# Patient Record
Sex: Female | Born: 1940 | Race: White | Hispanic: No | Marital: Married | Smoking: Never smoker
Health system: Southern US, Community
[De-identification: ages and names within clinical notes are randomized; demographics above are authoritative.]

## PROBLEM LIST (undated history)

## (undated) DIAGNOSIS — E538 Deficiency of other specified B group vitamins: Secondary | ICD-10-CM

## (undated) DIAGNOSIS — R7303 Prediabetes: Secondary | ICD-10-CM

## (undated) DIAGNOSIS — I10 Essential (primary) hypertension: Secondary | ICD-10-CM

## (undated) HISTORY — PX: TONSILLECTOMY: SUR1361

## (undated) HISTORY — PX: EYE SURGERY: SHX253

## (undated) HISTORY — PX: ABDOMINAL HYSTERECTOMY: SHX81

---

## 2001-05-19 ENCOUNTER — Encounter: Payer: Self-pay | Admitting: Internal Medicine

## 2001-05-19 ENCOUNTER — Encounter: Admission: RE | Admit: 2001-05-19 | Discharge: 2001-05-19 | Payer: Self-pay | Admitting: Internal Medicine

## 2002-01-05 ENCOUNTER — Encounter: Admission: RE | Admit: 2002-01-05 | Discharge: 2002-01-05 | Payer: Self-pay | Admitting: Internal Medicine

## 2002-01-05 ENCOUNTER — Encounter: Payer: Self-pay | Admitting: Internal Medicine

## 2002-07-13 ENCOUNTER — Encounter: Admission: RE | Admit: 2002-07-13 | Discharge: 2002-07-13 | Payer: Self-pay | Admitting: Internal Medicine

## 2002-07-13 ENCOUNTER — Encounter: Payer: Self-pay | Admitting: Internal Medicine

## 2003-02-11 ENCOUNTER — Encounter: Admission: RE | Admit: 2003-02-11 | Discharge: 2003-02-11 | Payer: Self-pay | Admitting: Neurosurgery

## 2003-02-22 ENCOUNTER — Ambulatory Visit (HOSPITAL_COMMUNITY): Admission: RE | Admit: 2003-02-22 | Discharge: 2003-02-22 | Payer: Self-pay | Admitting: Neurosurgery

## 2003-03-14 ENCOUNTER — Encounter: Admission: RE | Admit: 2003-03-14 | Discharge: 2003-03-14 | Payer: Self-pay | Admitting: Neurosurgery

## 2004-08-06 ENCOUNTER — Other Ambulatory Visit: Admission: RE | Admit: 2004-08-06 | Discharge: 2004-08-06 | Payer: Self-pay | Admitting: Internal Medicine

## 2004-09-07 ENCOUNTER — Encounter: Admission: RE | Admit: 2004-09-07 | Discharge: 2004-09-07 | Payer: Self-pay | Admitting: Internal Medicine

## 2005-09-25 ENCOUNTER — Encounter: Admission: RE | Admit: 2005-09-25 | Discharge: 2005-09-25 | Payer: Self-pay | Admitting: Internal Medicine

## 2006-10-03 ENCOUNTER — Encounter: Admission: RE | Admit: 2006-10-03 | Discharge: 2006-10-03 | Payer: Self-pay | Admitting: Internal Medicine

## 2007-10-07 ENCOUNTER — Encounter: Admission: RE | Admit: 2007-10-07 | Discharge: 2007-10-07 | Payer: Self-pay | Admitting: Internal Medicine

## 2008-11-01 ENCOUNTER — Encounter: Admission: RE | Admit: 2008-11-01 | Discharge: 2008-11-01 | Payer: Self-pay | Admitting: Internal Medicine

## 2009-12-04 ENCOUNTER — Encounter: Admission: RE | Admit: 2009-12-04 | Discharge: 2009-12-04 | Payer: Self-pay | Admitting: Internal Medicine

## 2009-12-15 ENCOUNTER — Encounter: Admission: RE | Admit: 2009-12-15 | Discharge: 2009-12-15 | Payer: Self-pay | Admitting: Internal Medicine

## 2010-02-03 ENCOUNTER — Encounter: Payer: Self-pay | Admitting: Neurosurgery

## 2010-02-04 ENCOUNTER — Encounter: Payer: Self-pay | Admitting: Internal Medicine

## 2010-06-01 NOTE — Op Note (Signed)
NAMEARORA, COAKLEY                          ACCOUNT NO.:  [id number]   MEDICAL RECORD NO.:  [id number]                   PATIENT TYPE:  OIB   LOCATION:  2899                                 FACILITY:  [facility]   PHYSICIAN:  Reinaldo Meeker, M.D.              DATE OF BIRTH:  04-01-1940   DATE OF PROCEDURE:  02/22/2003  DATE OF DISCHARGE:                                 OPERATIVE REPORT   SURGEON:  Reinaldo Meeker, M.D.   ASSISTANT:  Kathaleen Maser. Pool, M.D.   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   PROCEDURE:  A C6-7 anterior cervical diskectomy with bone bank fusion,  followed by Zephyr anterior cervical plating.   DESCRIPTION OF PROCEDURE:  After being placed in the supine position and 5  pounds of Holter traction, the patient's neck was prepped and draped in the  usual sterile fashion.  Localizing x-rays were taken prior to the incision,  to identify the appropriate level.  A transverse incision was made in the  right anterior neck, starting at the midline, and headed towards the medial  aspect of the sternocleidomastoid muscle.  The platysma muscle was then  incised transversely.  The natural fascial plane between the strap muscles  medially and the sternocleidomastoid laterally was identified and followed  down to the anterior aspect of the cervical spine.  The longus coli muscles  were identified and split in the midline, and stripped away bilaterally with  the Kitner resection and key elevator.  A self-retaining retractor was  placed for exposure, and an x-ray showed the approach at the appropriate  level.  Using a #15 blade, the annulus and disk at C6-7 was incised.  Using  pituitary rongeurs and curets, approximately 90% of the disk material was  removed.  The microscope was then draped and brought into the field and used  for the remainder of the case.  Using the microdissection technique, the  remainder of the disk material down to the posterior longitudinal ligament  was removed.  A high-speed drill was used to widen the interspace.  The  ligament was then incised transversely and the cut edges removed with the  Kerrison punch.  Inspection of the left C6-7 foramen yielded a large amount  of herniated disk material, and this was removed until the underlying C7  nerve root was well-visualized and decompressed.  Inspection of the right  side was then carried out with the removal of any residual tissue, until the  nerve root on that side was decompressed as well.  At this point inspection  was carried out in all directions for any evidence of residual compression,  and none could be identified.  Large amounts of irrigation were carried out.  Measurements were taken.  A 7 mm bone bank bone was reconstituted.  After  irrigating once more to confirm  hemostasis, the bone was impacted without  difficulty.  A 23 mm Zephyr anterior cervical plate was then chosen.  Drill  holes were then placed, followed by the placement of 13 mm screws x4.  The  locking mechanism was then secured.  A final x-ray showed the plate, screws  and plug to all be in good position.  Large amounts of irrigation were  carried out at this time, and any bleeding controlled with bipolar  electrocoagulation.  The wound was then closed using interrupted Vicryl in  the platysma muscle, inverted #5-0 PDS in the subcuticular layer, and Steri-  Strips on the skin.  A soft collar was then applied.  The patient was extubated and taken to the recovery room in stable  condition.                                              Reinaldo Meeker, M.D.   ROK/MEDQ  D:  02/22/2003  T:  02/22/2003  Job:  [id number]

## 2010-11-05 ENCOUNTER — Other Ambulatory Visit: Payer: Self-pay | Admitting: Internal Medicine

## 2010-11-05 DIAGNOSIS — R1010 Upper abdominal pain, unspecified: Secondary | ICD-10-CM

## 2010-11-06 ENCOUNTER — Ambulatory Visit
Admission: RE | Admit: 2010-11-06 | Discharge: 2010-11-06 | Disposition: A | Discharge status: Home / Self Care | Payer: Medicare Other | Source: Ambulatory Visit | Attending: Internal Medicine | Admitting: Internal Medicine

## 2010-11-06 DIAGNOSIS — R1010 Upper abdominal pain, unspecified: Secondary | ICD-10-CM

## 2010-11-13 ENCOUNTER — Other Ambulatory Visit: Payer: Self-pay | Admitting: Internal Medicine

## 2010-11-13 DIAGNOSIS — Z1231 Encounter for screening mammogram for malignant neoplasm of breast: Secondary | ICD-10-CM

## 2010-12-21 ENCOUNTER — Ambulatory Visit
Admission: RE | Admit: 2010-12-21 | Discharge: 2010-12-21 | Disposition: A | Discharge status: Home / Self Care | Payer: Medicare Other | Source: Ambulatory Visit | Attending: Internal Medicine | Admitting: Internal Medicine

## 2010-12-21 DIAGNOSIS — Z1231 Encounter for screening mammogram for malignant neoplasm of breast: Secondary | ICD-10-CM

## 2011-11-18 ENCOUNTER — Other Ambulatory Visit: Payer: Self-pay | Admitting: Internal Medicine

## 2011-11-18 DIAGNOSIS — Z1231 Encounter for screening mammogram for malignant neoplasm of breast: Secondary | ICD-10-CM

## 2011-12-27 ENCOUNTER — Ambulatory Visit
Admission: RE | Admit: 2011-12-27 | Discharge: 2011-12-27 | Disposition: A | Discharge status: Home / Self Care | Payer: Medicare Other | Source: Ambulatory Visit | Attending: Internal Medicine | Admitting: Internal Medicine

## 2011-12-27 DIAGNOSIS — Z1231 Encounter for screening mammogram for malignant neoplasm of breast: Secondary | ICD-10-CM

## 2012-11-24 ENCOUNTER — Other Ambulatory Visit: Payer: Self-pay

## 2012-11-24 DIAGNOSIS — Z1231 Encounter for screening mammogram for malignant neoplasm of breast: Secondary | ICD-10-CM

## 2012-12-30 ENCOUNTER — Ambulatory Visit: Payer: Medicare Other

## 2013-01-21 ENCOUNTER — Ambulatory Visit
Admission: RE | Admit: 2013-01-21 | Discharge: 2013-01-21 | Disposition: A | Discharge status: Home / Self Care | Payer: Medicare Other | Source: Ambulatory Visit

## 2013-01-21 DIAGNOSIS — Z1231 Encounter for screening mammogram for malignant neoplasm of breast: Secondary | ICD-10-CM

## 2013-12-17 ENCOUNTER — Other Ambulatory Visit: Payer: Self-pay

## 2013-12-17 DIAGNOSIS — Z1231 Encounter for screening mammogram for malignant neoplasm of breast: Secondary | ICD-10-CM

## 2014-01-25 ENCOUNTER — Ambulatory Visit
Admission: RE | Admit: 2014-01-25 | Discharge: 2014-01-25 | Disposition: A | Discharge status: Home / Self Care | Payer: Medicare (Managed Care) | Source: Ambulatory Visit

## 2014-01-25 DIAGNOSIS — Z1231 Encounter for screening mammogram for malignant neoplasm of breast: Secondary | ICD-10-CM

## 2014-05-03 ENCOUNTER — Other Ambulatory Visit: Payer: Self-pay | Admitting: Internal Medicine

## 2014-05-03 DIAGNOSIS — R1010 Upper abdominal pain, unspecified: Secondary | ICD-10-CM

## 2014-12-22 ENCOUNTER — Other Ambulatory Visit: Payer: Self-pay

## 2014-12-22 DIAGNOSIS — Z1231 Encounter for screening mammogram for malignant neoplasm of breast: Secondary | ICD-10-CM

## 2015-01-30 ENCOUNTER — Ambulatory Visit
Admission: RE | Admit: 2015-01-30 | Discharge: 2015-01-30 | Disposition: A | Discharge status: Home / Self Care | Payer: Medicare Other | Source: Ambulatory Visit

## 2015-01-30 DIAGNOSIS — Z1231 Encounter for screening mammogram for malignant neoplasm of breast: Secondary | ICD-10-CM

## 2015-12-26 ENCOUNTER — Other Ambulatory Visit: Payer: Self-pay | Admitting: Internal Medicine

## 2015-12-26 DIAGNOSIS — Z1231 Encounter for screening mammogram for malignant neoplasm of breast: Secondary | ICD-10-CM

## 2016-02-01 ENCOUNTER — Ambulatory Visit: Payer: Medicare Other

## 2016-02-23 ENCOUNTER — Ambulatory Visit: Payer: Medicare Other

## 2016-04-18 ENCOUNTER — Ambulatory Visit
Admission: RE | Admit: 2016-04-18 | Discharge: 2016-04-18 | Disposition: A | Discharge status: Home / Self Care | Payer: Medicare Other | Source: Ambulatory Visit | Attending: Internal Medicine | Admitting: Internal Medicine

## 2016-04-18 DIAGNOSIS — Z1231 Encounter for screening mammogram for malignant neoplasm of breast: Secondary | ICD-10-CM

## 2017-07-21 ENCOUNTER — Other Ambulatory Visit: Payer: Self-pay | Admitting: Internal Medicine

## 2017-07-21 DIAGNOSIS — Z1231 Encounter for screening mammogram for malignant neoplasm of breast: Secondary | ICD-10-CM

## 2017-08-26 ENCOUNTER — Ambulatory Visit
Admission: RE | Admit: 2017-08-26 | Discharge: 2017-08-26 | Disposition: A | Discharge status: Home / Self Care | Payer: Medicare Other | Source: Ambulatory Visit | Attending: Internal Medicine | Admitting: Internal Medicine

## 2017-08-26 DIAGNOSIS — Z1231 Encounter for screening mammogram for malignant neoplasm of breast: Secondary | ICD-10-CM

## 2018-07-21 ENCOUNTER — Other Ambulatory Visit: Payer: Self-pay | Admitting: Internal Medicine

## 2018-07-21 DIAGNOSIS — Z1231 Encounter for screening mammogram for malignant neoplasm of breast: Secondary | ICD-10-CM

## 2018-09-07 ENCOUNTER — Ambulatory Visit: Payer: Medicare Other

## 2018-10-01 ENCOUNTER — Ambulatory Visit
Admission: RE | Admit: 2018-10-01 | Discharge: 2018-10-01 | Disposition: A | Discharge status: Home / Self Care | Payer: Medicare Other | Source: Ambulatory Visit | Attending: Internal Medicine | Admitting: Internal Medicine

## 2018-10-01 ENCOUNTER — Other Ambulatory Visit: Payer: Self-pay

## 2018-10-01 DIAGNOSIS — Z1231 Encounter for screening mammogram for malignant neoplasm of breast: Secondary | ICD-10-CM

## 2019-03-29 DIAGNOSIS — R195 Other fecal abnormalities: Secondary | ICD-10-CM | POA: Diagnosis not present

## 2019-04-29 DIAGNOSIS — Z1159 Encounter for screening for other viral diseases: Secondary | ICD-10-CM | POA: Diagnosis not present

## 2019-05-04 DIAGNOSIS — K5730 Diverticulosis of large intestine without perforation or abscess without bleeding: Secondary | ICD-10-CM | POA: Diagnosis not present

## 2019-05-04 DIAGNOSIS — R195 Other fecal abnormalities: Secondary | ICD-10-CM | POA: Diagnosis not present

## 2019-05-04 DIAGNOSIS — K5520 Angiodysplasia of colon without hemorrhage: Secondary | ICD-10-CM | POA: Diagnosis not present

## 2019-05-04 DIAGNOSIS — D122 Benign neoplasm of ascending colon: Secondary | ICD-10-CM | POA: Diagnosis not present

## 2019-05-07 DIAGNOSIS — D122 Benign neoplasm of ascending colon: Secondary | ICD-10-CM | POA: Diagnosis not present

## 2019-07-26 DIAGNOSIS — D225 Melanocytic nevi of trunk: Secondary | ICD-10-CM | POA: Diagnosis not present

## 2019-07-26 DIAGNOSIS — D2261 Melanocytic nevi of right upper limb, including shoulder: Secondary | ICD-10-CM | POA: Diagnosis not present

## 2019-07-26 DIAGNOSIS — X32XXXA Exposure to sunlight, initial encounter: Secondary | ICD-10-CM | POA: Diagnosis not present

## 2019-07-26 DIAGNOSIS — D2271 Melanocytic nevi of right lower limb, including hip: Secondary | ICD-10-CM | POA: Diagnosis not present

## 2019-07-26 DIAGNOSIS — D2262 Melanocytic nevi of left upper limb, including shoulder: Secondary | ICD-10-CM | POA: Diagnosis not present

## 2019-07-26 DIAGNOSIS — L668 Other cicatricial alopecia: Secondary | ICD-10-CM | POA: Diagnosis not present

## 2019-07-26 DIAGNOSIS — L570 Actinic keratosis: Secondary | ICD-10-CM | POA: Diagnosis not present

## 2019-07-26 DIAGNOSIS — Z85828 Personal history of other malignant neoplasm of skin: Secondary | ICD-10-CM | POA: Diagnosis not present

## 2019-08-27 ENCOUNTER — Other Ambulatory Visit: Payer: Self-pay | Admitting: Internal Medicine

## 2019-08-27 DIAGNOSIS — Z1231 Encounter for screening mammogram for malignant neoplasm of breast: Secondary | ICD-10-CM

## 2019-09-14 DIAGNOSIS — H5203 Hypermetropia, bilateral: Secondary | ICD-10-CM | POA: Diagnosis not present

## 2019-09-14 DIAGNOSIS — H35033 Hypertensive retinopathy, bilateral: Secondary | ICD-10-CM | POA: Diagnosis not present

## 2019-10-04 ENCOUNTER — Ambulatory Visit
Admission: RE | Admit: 2019-10-04 | Discharge: 2019-10-04 | Disposition: A | Discharge status: Home / Self Care | Payer: Medicare PPO | Source: Ambulatory Visit | Attending: Internal Medicine | Admitting: Internal Medicine

## 2019-10-04 ENCOUNTER — Other Ambulatory Visit: Payer: Self-pay

## 2019-10-04 DIAGNOSIS — Z1231 Encounter for screening mammogram for malignant neoplasm of breast: Secondary | ICD-10-CM | POA: Diagnosis not present

## 2019-10-08 ENCOUNTER — Other Ambulatory Visit: Payer: Self-pay | Admitting: Internal Medicine

## 2019-10-08 ENCOUNTER — Ambulatory Visit
Admission: RE | Admit: 2019-10-08 | Discharge: 2019-10-08 | Disposition: A | Discharge status: Home / Self Care | Payer: Medicare PPO | Source: Ambulatory Visit | Attending: Internal Medicine | Admitting: Internal Medicine

## 2019-10-08 DIAGNOSIS — R1900 Intra-abdominal and pelvic swelling, mass and lump, unspecified site: Secondary | ICD-10-CM | POA: Diagnosis not present

## 2019-10-08 DIAGNOSIS — K5730 Diverticulosis of large intestine without perforation or abscess without bleeding: Secondary | ICD-10-CM | POA: Diagnosis not present

## 2019-10-08 DIAGNOSIS — R109 Unspecified abdominal pain: Secondary | ICD-10-CM

## 2019-10-08 MED ORDER — IOPAMIDOL (ISOVUE-300) INJECTION 61%
100.0000 mL | Freq: Once | INTRAVENOUS | Status: AC | PRN
  Administered 2019-10-08: 100 mL via INTRAVENOUS

## 2019-11-03 DIAGNOSIS — Z23 Encounter for immunization: Secondary | ICD-10-CM | POA: Diagnosis not present

## 2019-11-09 DIAGNOSIS — H35363 Drusen (degenerative) of macula, bilateral: Secondary | ICD-10-CM | POA: Diagnosis not present

## 2019-11-24 ENCOUNTER — Ambulatory Visit: Payer: Medicare PPO | Attending: Internal Medicine

## 2019-11-24 ENCOUNTER — Other Ambulatory Visit (HOSPITAL_COMMUNITY): Payer: Self-pay | Admitting: Internal Medicine

## 2019-11-24 DIAGNOSIS — Z23 Encounter for immunization: Secondary | ICD-10-CM

## 2019-11-24 NOTE — Progress Notes (Signed)
   Covid-19 Vaccination Clinic  Name:  Anne Ford    MRN: [id number] DOB: Mar 17, 1940  11/24/2019  [name] was observed post Covid-19 immunization for 15 minutes without incident. She was provided with Vaccine Information Sheet and instruction to access the V-Safe system.   [name] was instructed to call 911 with any severe reactions post vaccine: [name] Kitchen Difficulty breathing  . Swelling of face and throat  . A fast heartbeat  . A bad rash all over body  . Dizziness and weakness

## 2019-11-25 DIAGNOSIS — H18413 Arcus senilis, bilateral: Secondary | ICD-10-CM | POA: Diagnosis not present

## 2019-11-25 DIAGNOSIS — H353132 Nonexudative age-related macular degeneration, bilateral, intermediate dry stage: Secondary | ICD-10-CM | POA: Diagnosis not present

## 2019-11-25 DIAGNOSIS — H26493 Other secondary cataract, bilateral: Secondary | ICD-10-CM | POA: Diagnosis not present

## 2019-11-25 DIAGNOSIS — H26491 Other secondary cataract, right eye: Secondary | ICD-10-CM | POA: Diagnosis not present

## 2019-11-25 DIAGNOSIS — Z961 Presence of intraocular lens: Secondary | ICD-10-CM | POA: Diagnosis not present

## 2019-11-29 DIAGNOSIS — H02811 Retained foreign body in right upper eyelid: Secondary | ICD-10-CM | POA: Diagnosis not present

## 2019-11-29 DIAGNOSIS — H26491 Other secondary cataract, right eye: Secondary | ICD-10-CM | POA: Diagnosis not present

## 2019-12-14 DIAGNOSIS — H26492 Other secondary cataract, left eye: Secondary | ICD-10-CM | POA: Diagnosis not present

## 2020-02-03 DIAGNOSIS — I10 Essential (primary) hypertension: Secondary | ICD-10-CM | POA: Diagnosis not present

## 2020-02-03 DIAGNOSIS — M810 Age-related osteoporosis without current pathological fracture: Secondary | ICD-10-CM | POA: Diagnosis not present

## 2020-02-03 DIAGNOSIS — J309 Allergic rhinitis, unspecified: Secondary | ICD-10-CM | POA: Diagnosis not present

## 2020-02-03 DIAGNOSIS — Z1389 Encounter for screening for other disorder: Secondary | ICD-10-CM | POA: Diagnosis not present

## 2020-02-03 DIAGNOSIS — Z0000 Encounter for general adult medical examination without abnormal findings: Secondary | ICD-10-CM | POA: Diagnosis not present

## 2020-02-03 DIAGNOSIS — E7800 Pure hypercholesterolemia, unspecified: Secondary | ICD-10-CM | POA: Diagnosis not present

## 2020-02-03 DIAGNOSIS — R7309 Other abnormal glucose: Secondary | ICD-10-CM | POA: Diagnosis not present

## 2020-02-05 ENCOUNTER — Other Ambulatory Visit: Payer: Self-pay | Admitting: Internal Medicine

## 2020-02-05 DIAGNOSIS — M810 Age-related osteoporosis without current pathological fracture: Secondary | ICD-10-CM

## 2020-02-11 ENCOUNTER — Other Ambulatory Visit: Payer: Self-pay | Admitting: Internal Medicine

## 2020-02-11 DIAGNOSIS — Z1231 Encounter for screening mammogram for malignant neoplasm of breast: Secondary | ICD-10-CM

## 2020-02-28 DIAGNOSIS — L299 Pruritus, unspecified: Secondary | ICD-10-CM | POA: Diagnosis not present

## 2020-02-28 DIAGNOSIS — L821 Other seborrheic keratosis: Secondary | ICD-10-CM | POA: Diagnosis not present

## 2020-07-25 DIAGNOSIS — D2262 Melanocytic nevi of left upper limb, including shoulder: Secondary | ICD-10-CM | POA: Diagnosis not present

## 2020-07-25 DIAGNOSIS — D2271 Melanocytic nevi of right lower limb, including hip: Secondary | ICD-10-CM | POA: Diagnosis not present

## 2020-07-25 DIAGNOSIS — D2261 Melanocytic nevi of right upper limb, including shoulder: Secondary | ICD-10-CM | POA: Diagnosis not present

## 2020-07-25 DIAGNOSIS — L570 Actinic keratosis: Secondary | ICD-10-CM | POA: Diagnosis not present

## 2020-07-25 DIAGNOSIS — X32XXXA Exposure to sunlight, initial encounter: Secondary | ICD-10-CM | POA: Diagnosis not present

## 2020-07-25 DIAGNOSIS — D2272 Melanocytic nevi of left lower limb, including hip: Secondary | ICD-10-CM | POA: Diagnosis not present

## 2020-07-25 DIAGNOSIS — Z85828 Personal history of other malignant neoplasm of skin: Secondary | ICD-10-CM | POA: Diagnosis not present

## 2020-07-25 DIAGNOSIS — L821 Other seborrheic keratosis: Secondary | ICD-10-CM | POA: Diagnosis not present

## 2020-09-07 ENCOUNTER — Ambulatory Visit
Admission: RE | Admit: 2020-09-07 | Discharge: 2020-09-07 | Disposition: A | Discharge status: Home / Self Care | Payer: Medicare PPO | Source: Ambulatory Visit | Attending: Physician Assistant | Admitting: Physician Assistant

## 2020-09-07 ENCOUNTER — Other Ambulatory Visit: Payer: Self-pay | Admitting: Physician Assistant

## 2020-09-07 DIAGNOSIS — M545 Low back pain, unspecified: Secondary | ICD-10-CM

## 2020-09-07 DIAGNOSIS — R350 Frequency of micturition: Secondary | ICD-10-CM | POA: Diagnosis not present

## 2020-09-07 DIAGNOSIS — R102 Pelvic and perineal pain: Secondary | ICD-10-CM | POA: Diagnosis not present

## 2020-09-07 DIAGNOSIS — I10 Essential (primary) hypertension: Secondary | ICD-10-CM | POA: Diagnosis not present

## 2020-09-25 DIAGNOSIS — H524 Presbyopia: Secondary | ICD-10-CM | POA: Diagnosis not present

## 2020-10-04 ENCOUNTER — Ambulatory Visit
Admission: RE | Admit: 2020-10-04 | Discharge: 2020-10-04 | Disposition: A | Discharge status: Home / Self Care | Payer: Medicare PPO | Source: Ambulatory Visit | Attending: Internal Medicine | Admitting: Internal Medicine

## 2020-10-04 ENCOUNTER — Other Ambulatory Visit: Payer: Self-pay

## 2020-10-04 DIAGNOSIS — M810 Age-related osteoporosis without current pathological fracture: Secondary | ICD-10-CM

## 2020-10-04 DIAGNOSIS — M85851 Other specified disorders of bone density and structure, right thigh: Secondary | ICD-10-CM | POA: Diagnosis not present

## 2020-10-04 DIAGNOSIS — Z780 Asymptomatic menopausal state: Secondary | ICD-10-CM | POA: Diagnosis not present

## 2020-10-04 DIAGNOSIS — Z1231 Encounter for screening mammogram for malignant neoplasm of breast: Secondary | ICD-10-CM

## 2020-10-23 DIAGNOSIS — J309 Allergic rhinitis, unspecified: Secondary | ICD-10-CM | POA: Diagnosis not present

## 2020-10-23 DIAGNOSIS — H9202 Otalgia, left ear: Secondary | ICD-10-CM | POA: Diagnosis not present

## 2020-12-13 DIAGNOSIS — Z23 Encounter for immunization: Secondary | ICD-10-CM | POA: Diagnosis not present

## 2020-12-13 DIAGNOSIS — R413 Other amnesia: Secondary | ICD-10-CM | POA: Diagnosis not present

## 2020-12-18 ENCOUNTER — Other Ambulatory Visit: Payer: Self-pay | Admitting: Internal Medicine

## 2020-12-18 DIAGNOSIS — R413 Other amnesia: Secondary | ICD-10-CM

## 2020-12-22 ENCOUNTER — Ambulatory Visit
Admission: RE | Admit: 2020-12-22 | Discharge: 2020-12-22 | Disposition: A | Discharge status: Home / Self Care | Payer: Medicare PPO | Source: Ambulatory Visit | Attending: Internal Medicine | Admitting: Internal Medicine

## 2020-12-22 ENCOUNTER — Other Ambulatory Visit: Payer: Self-pay

## 2020-12-22 DIAGNOSIS — R413 Other amnesia: Secondary | ICD-10-CM

## 2020-12-22 DIAGNOSIS — G319 Degenerative disease of nervous system, unspecified: Secondary | ICD-10-CM | POA: Diagnosis not present

## 2021-02-09 DIAGNOSIS — R7303 Prediabetes: Secondary | ICD-10-CM | POA: Diagnosis not present

## 2021-02-09 DIAGNOSIS — E782 Mixed hyperlipidemia: Secondary | ICD-10-CM | POA: Diagnosis not present

## 2021-02-09 DIAGNOSIS — I10 Essential (primary) hypertension: Secondary | ICD-10-CM | POA: Diagnosis not present

## 2021-02-09 DIAGNOSIS — R202 Paresthesia of skin: Secondary | ICD-10-CM | POA: Diagnosis not present

## 2021-02-09 DIAGNOSIS — J309 Allergic rhinitis, unspecified: Secondary | ICD-10-CM | POA: Diagnosis not present

## 2021-02-09 DIAGNOSIS — M810 Age-related osteoporosis without current pathological fracture: Secondary | ICD-10-CM | POA: Diagnosis not present

## 2021-02-09 DIAGNOSIS — Z1389 Encounter for screening for other disorder: Secondary | ICD-10-CM | POA: Diagnosis not present

## 2021-02-09 DIAGNOSIS — K219 Gastro-esophageal reflux disease without esophagitis: Secondary | ICD-10-CM | POA: Diagnosis not present

## 2021-02-09 DIAGNOSIS — R413 Other amnesia: Secondary | ICD-10-CM | POA: Diagnosis not present

## 2021-02-09 DIAGNOSIS — Z0000 Encounter for general adult medical examination without abnormal findings: Secondary | ICD-10-CM | POA: Diagnosis not present

## 2021-02-15 ENCOUNTER — Ambulatory Visit: Payer: Medicare PPO | Admitting: Physician Assistant

## 2021-02-15 ENCOUNTER — Other Ambulatory Visit: Payer: Self-pay

## 2021-02-15 ENCOUNTER — Other Ambulatory Visit (INDEPENDENT_AMBULATORY_CARE_PROVIDER_SITE_OTHER): Payer: Medicare PPO

## 2021-02-15 VITALS — BP 127/76 | HR 80 | Ht 64.0 in | Wt 142.0 lb

## 2021-02-15 DIAGNOSIS — G309 Alzheimer's disease, unspecified: Secondary | ICD-10-CM | POA: Diagnosis not present

## 2021-02-15 DIAGNOSIS — F0280 Dementia in other diseases classified elsewhere without behavioral disturbance: Secondary | ICD-10-CM | POA: Diagnosis not present

## 2021-02-15 DIAGNOSIS — R202 Paresthesia of skin: Secondary | ICD-10-CM | POA: Insufficient documentation

## 2021-02-15 DIAGNOSIS — M9390 Osteochondropathy, unspecified of unspecified site: Secondary | ICD-10-CM | POA: Insufficient documentation

## 2021-02-15 DIAGNOSIS — R413 Other amnesia: Secondary | ICD-10-CM | POA: Insufficient documentation

## 2021-02-15 DIAGNOSIS — K219 Gastro-esophageal reflux disease without esophagitis: Secondary | ICD-10-CM | POA: Insufficient documentation

## 2021-02-15 DIAGNOSIS — G629 Polyneuropathy, unspecified: Secondary | ICD-10-CM | POA: Diagnosis not present

## 2021-02-15 DIAGNOSIS — E7800 Pure hypercholesterolemia, unspecified: Secondary | ICD-10-CM | POA: Insufficient documentation

## 2021-02-15 DIAGNOSIS — R7303 Prediabetes: Secondary | ICD-10-CM | POA: Insufficient documentation

## 2021-02-15 DIAGNOSIS — I10 Essential (primary) hypertension: Secondary | ICD-10-CM | POA: Insufficient documentation

## 2021-02-15 DIAGNOSIS — J309 Allergic rhinitis, unspecified: Secondary | ICD-10-CM | POA: Insufficient documentation

## 2021-02-15 LAB — VITAMIN B12: Vitamin B-12: 252 pg/mL (ref 211–911)

## 2021-02-15 LAB — TSH: TSH: 1.94 u[IU]/mL (ref 0.35–5.50)

## 2021-02-15 NOTE — Progress Notes (Signed)
Assessment/Plan:   Anne Ford is a very pleasant 81 y.o. year old RH female with  a history of hypertension, hyperlipidemia, vitamin D deficiency, prediabetes, anxiety, depression seen today for evaluation of memory loss and paresthesias. MoCA today is 15/30, with deficiencies in delayed recall 0/5, abstraction, naming 1/3, and visuospatial 3/5. Patient is on memantine 10 mg twice daily by PCP.  (Was on memantine daily until late January).  Last CT of the head was remarkable for mild generalized cerebral and cerebellar atrophy without acute intracranial abnormalities.   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  Discussed the importance of regular daily schedule with inclusion of crossword puzzles to maintain brain function.  Continue to monitor mood with PCP.  Stay active at least 30 minutes at least 3 times a week.  Naps should be scheduled and should be no longer than 60 minutes and should not occur after 2 PM.  Control cardiovascular risk factors  Monitor driving Mediterranean diet is recommended  Folllow up  in 1 month   Paresthesias  Patient complains of bilateral paresthesias, worse from the elbow down to the hands, and also on the feet bilaterally, left greater than right.  She denies any prior history of a stroke.  Exam and work-up essentially unremarkable for stroke findings, she does show some slow speech, but according to her family this has improved over the last 2 weeks, of unknown significance. Will check NCV-EMG Follow-up in 1 month   Subjective:    The patient is seen in neurologic consultation at the request of Georgann Housekeeper, MD for the evaluation of memory.  The patient is accompanied by  who supplements the history.  This is a 81 y.o. year old RH  female who has had memory issues for about 1.5 years, worse over the last 2 months.  During the last Thanksgiving, her family noticed that she was having worsening of memory and speech disturbance.  Church members began to notice as well, when she was repeating the same stories, asking the same questions.  Her PCP placed her on memantine 5 mg daily, then increasing it to 10 mg daily which "for a while it helped especially with the slurring ", but over the last 2 weeks, he increase it to 10 mg twice daily without significant improvement.  Her speech continues to be slow, but family reports that this is better than prior.  She becomes very upset because she has always been very organized, in charge of every document in the house, and now she is having problems and trying to stay focused and organized.  She denies being confused when entering the room, or leaving objects in unusual places.  She continues to drive but short distances, because over the last 6 months, if not using the GPS she will become lost if driving to unknown locations.  This has brought significant amount of frustration on her.  She becomes tearful at times, when she thinks that she could have Alzheimer's disease, as she took care of her mother with Alzheimer's when she was 17 years old.  She continues to read and do crossword puzzles and word findings.  She sleeps well, denies vivid dreams, sleepwalking, hallucinations or paranoia.  She is independent of bathing and dressing, there are  no hygiene concerns.  She puts the medications on a pillbox (she could not think of the word "pillbox "), and checks a calendar frequently did not forget appointments.  Her husband has taken over the finances since last month.  Her appetite is good, denies trouble swallowing.  She does not cook very often.  She ambulates without a cane, but she had a couple of falls, one in June due to heat exhaustion resulting in presyncope, and  another mechanical fall hitting the right side of her head in November 2022 without loss of consciousness.  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  She denies focal numbness.  She denies a history of stroke.  She denies using the computer on excessive basis.  Denies hormonal therapy, or long distance trips.  Denies chest pain or palpitations.  Denies shortness of breath.  Denies a history of tremors or anosmia.  No history of seizures.  Denies urine frequency, but she does have mild incontinence.  Denies constipation or diarrhea.  Denies a history of sleep apnea, alcohol or tobacco.  States she has strong family history of Alzheimer's disease in mother, 2 sisters and maternal grandmother.   Recent labs 02/09/2021 shows abnormal lipid panel with TC 253, triglycerides 218, LDL 154 CMP calcium 10.8, creatinine 1.35. CBC was normal  CT scan of the head 12/22/2020 remarkable for no intracranial abnormality. Mild generalized cerebral and cerebellar atrophy.  Mild right ethmoid sinus mucosal thickening.    Allergies  Allergen Reactions   Codeine Other (See Comments)    GI upset   Pravastatin Sodium Other (See Comments)    Joint pain   Amoxicillin-Pot Clavulanate Nausea Only and Nausea And Vomiting   Latex Rash   Morphine Other (See Comments)    Other Reaction: CNS Disorder Other Reaction: CNS Disorder    Neomycin-Bacitracin-Polymyxin [Bacitracin-Neomycin-Polymyxin] Rash    Current Outpatient Medications  Medication Instructions   atenolol (TENORMIN) 25 MG tablet Oral   felodipine (PLENDIL) 5 MG 24 hr tablet Oral   fluticasone (FLONASE) 50 MCG/ACT nasal spray Each Nare   hydrochlorothiazide (HYDRODIURIL) 25 MG tablet Oral   loratadine  (CLARITIN) 10 MG tablet 1 tablet   memantine (NAMENDA) 10 mg, Oral, 2 times daily     VITALS:   Vitals:   02/15/21 0951  BP: 127/76  Pulse: 80  SpO2: 95%  Weight: 142 lb (64.4 kg)  Height: 5\' 4"  (1.626 m)   No flowsheet data found.  PHYSICAL EXAM   HEENT:  Normocephalic, atraumatic. The mucous membranes are moist. The superficial temporal arteries are without ropiness or tenderness. Cardiovascular: Regular rate and rhythm. Lungs: Clear to auscultation bilaterally. Neck: There are no carotid bruits noted bilaterally.  NEUROLOGICAL: Montreal Cognitive Assessment  02/16/2021  Visuospatial/ Executive (0/5) 3  Naming (0/3) 1  Attention: Read list of digits (0/2) 2  Attention: Read list of letters (0/1) 1  Attention: Serial 7 subtraction starting at 100 (0/3) 0  Language: Repeat phrase (0/2) 2  Language : Fluency (0/1) 1  Abstraction (0/2) 0  Delayed Recall (0/5) 0  Orientation (0/6) 5  Total 15  Adjusted Score (based on education) 15   No flowsheet data found.  No flowsheet data found.   Orientation:  Alert and  oriented to person, place and time. No aphasia or dysarthria, but she does demonstrate slower speech. Fund of knowledge is appropriate. Recent memory impaired and remote memory intact.  Attention and concentration are reduced.  Able to name objects 1/3  and able to repeat phrases. Delayed recall 0/5 Cranial nerves: There is good facial symmetry. Extraocular muscles are intact and visual fields are full to confrontational testing. Speech is slow and clear. Soft palate rises symmetrically and there is no tongue deviation. Hearing is intact to conversational tone.  No dystonia or tardive dyskinesia noted Tone: Tone is good throughout.  No cogwheel. Sensation: Sensation is intact to light touch and pinprick throughout. Vibration is intact at the bilateral big toe.There is no extinction with double simultaneous stimulation. There is no sensory dermatomal level  identified. Coordination: The patient has no difficulty with RAM's or FNF bilaterally. Normal finger to nose  Motor: Strength is 5/5 in the bilateral upper and lower extremities. There is no pronator drift. There are no fasciculations noted. DTR's: Deep tendon reflexes are 2/4 at the bilateral biceps, triceps, brachioradialis, patella and achilles.  Plantar responses are downgoing bilaterally. Gait and Station: The patient is able to ambulate without difficulty, but she does complain of "feet feeling weird when walking ".The patient is able to heel toe walk without any difficulty.The patient is able to ambulate in a tandem fashion. The patient is able to stand in the Romberg position.     Thank you for allowing us the opportunity to participate in the care of this nice patient. Please do not hesitate to contact us for any questions or concerns.   Total time spent on today's visit was 70 minutes, including both face-to-face time and nonface-to-face time.  Time included that spent on review of records (prior notes available to me/labs/imaging if pertinent), discussing treatment and goals, answering patient's questions and coordinating care.  Cc:  Georgann HousekeeperHusain, Karrar, MD  Marlowe KaysSara Niles Ess 02/16/2021 8:51 AM

## 2021-02-15 NOTE — Patient Instructions (Addendum)
It was a pleasure to see you today at our office.   Recommendations:  MRI of the brain, the radiology office will call you to arrange you appointment NCS/EMG Check labs today Continue Memantine 10 mg  2 times a day  Follow up 1 month   RECOMMENDATIONS FOR ALL PATIENTS WITH MEMORY PROBLEMS: 1. Continue to exercise (Recommend 30 minutes of walking everyday, or 3 hours every week) 2. Increase social interactions - continue going to [place] and enjoy social gatherings with friends and family 3. Eat healthy, avoid fried foods and eat more fruits and vegetables 4. Maintain adequate blood pressure, blood sugar, and blood cholesterol level. Reducing the risk of stroke and cardiovascular disease also helps promoting better memory. 5. Avoid stressful situations. Live a simple life and avoid aggravations. Organize your time and prepare for the next day in anticipation. 6. Sleep well, avoid any interruptions of sleep and avoid any distractions in the bedroom that may interfere with adequate sleep quality 7. Avoid sugar, avoid sweets as there is a strong link between excessive sugar intake, diabetes, and cognitive impairment We discussed the Mediterranean diet, which has been shown to help patients reduce the risk of progressive memory disorders and reduces cardiovascular risk. This includes eating fish, eat fruits and green leafy vegetables, nuts like almonds and hazelnuts, walnuts, and also use olive oil. Avoid fast foods and fried foods as much as possible. Avoid sweets and sugar as sugar use has been linked to worsening of memory function.  There is always a concern of gradual progression of memory problems. If this is the case, then we may need to adjust level of care according to patient needs. Support, both to the patient and caregiver, should then be put into place.     FALL PRECAUTIONS: Be cautious when walking. Scan the area for obstacles that may increase the risk of trips and falls. When  getting up in the mornings, sit up at the edge of the bed for a few minutes before getting out of bed. Consider elevating the bed at the head end to avoid drop of blood pressure when getting up. Walk always in a well-lit room (use night lights in the walls). Avoid area rugs or power cords from appliances in the middle of the walkways. Use a walker or a cane if necessary and consider physical therapy for balance exercise. Get your eyesight checked regularly.  FINANCIAL OVERSIGHT: Supervision, especially oversight when making financial decisions or transactions is also recommended.  HOME SAFETY: Consider the safety of the kitchen when operating appliances like stoves, microwave oven, and blender. Consider having supervision and share cooking responsibilities until no longer able to participate in those. Accidents with firearms and other hazards in the house should be identified and addressed as well.   ABILITY TO BE LEFT ALONE: If patient is unable to contact 911 operator, consider using LifeLine, or when the need is there, arrange for someone to stay with patients. Smoking is a fire hazard, consider supervision or cessation. Risk of wandering should be assessed by caregiver and if detected at any point, supervision and safe proof recommendations should be instituted.  MEDICATION SUPERVISION: Inability to self-administer medication needs to be constantly addressed. Implement a mechanism to ensure safe administration of the medications.   DRIVING: Regarding driving, in patients with progressive memory problems, driving will be impaired. We advise to have someone else do the driving if trouble finding directions or if minor accidents are reported. Independent driving assessment is available to determine safety  of driving.   If you are interested in the driving assessment, you can contact the following:  [employer] in [place]  [place]  [place] [phone] or [phone]    [place] refers to food and lifestyle choices that are based on the traditions of countries located on the [employer]. This way of eating has been shown to help prevent certain conditions and improve outcomes for people who have chronic diseases, like kidney disease and heart disease. What are tips for following this plan? Lifestyle  Cook and eat meals together with your family, when possible. Drink enough fluid to keep your urine clear or pale yellow. Be physically active every day. This includes: Aerobic exercise like running or swimming. Leisure activities like gardening, walking, or housework. Get 7-8 hours of sleep each night. If recommended by your health care provider, drink red wine in moderation. This means 1 glass a day for nonpregnant women and 2 glasses a day for men. A glass of wine equals 5 oz (150 mL). Reading food labels  Check the serving size of packaged foods. For foods such as rice and pasta, the serving size refers to the amount of cooked product, not dry. Check the total fat in packaged foods. Avoid foods that have saturated fat or trans fats. Check the ingredients list for added sugars, such as corn syrup. Shopping  At the grocery store, buy most of your food from the areas near the walls of the store. This includes: Fresh fruits and vegetables (produce). Grains, beans, nuts, and seeds. Some of these may be available in unpackaged forms or large amounts (in bulk). Fresh seafood. Poultry and eggs. Low-fat dairy products. Buy whole ingredients instead of prepackaged foods. Buy fresh fruits and vegetables in-season from local farmers markets. Buy frozen fruits and vegetables in resealable bags. If you do not have access to quality fresh seafood, buy precooked frozen shrimp or canned fish, such as tuna, salmon, or sardines. Buy  small amounts of raw or cooked vegetables, salads, or olives from the deli or salad bar at your store. Stock your pantry so you always have certain foods on hand, such as olive oil, canned tuna, canned tomatoes, rice, pasta, and beans. Cooking  Cook foods with extra-virgin olive oil instead of using butter or other vegetable oils. Have meat as a side dish, and have vegetables or grains as your main dish. This means having meat in small portions or adding small amounts of meat to foods like pasta or stew. Use beans or vegetables instead of meat in common dishes like chili or lasagna. Experiment with different cooking methods. Try roasting or broiling vegetables instead of steaming or sauteing them. Add frozen vegetables to soups, stews, pasta, or rice. Add nuts or seeds for added healthy fat at each meal. You can add these to yogurt, salads, or vegetable dishes. Marinate fish or vegetables using olive oil, lemon juice, garlic, and fresh herbs. Meal planning  Plan to eat 1 vegetarian meal one day each week. Try to work up to 2 vegetarian meals, if possible. Eat seafood 2 or more times a week. Have healthy snacks readily available, such as: Vegetable sticks with hummus. Greek yogurt. Fruit and nut trail mix. Eat balanced meals throughout the week. This includes: Fruit: 2-3 servings a day Vegetables: 4-5 servings a day Low-fat dairy: 2 servings a day Fish, poultry, or lean meat: 1 serving a day Beans and  legumes: 2 or more servings a week Nuts and seeds: 1-2 servings a day Whole grains: 6-8 servings a day Extra-virgin olive oil: 3-4 servings a day Limit red meat and sweets to only a few servings a month What are my food choices? Mediterranean diet Recommended Grains: Whole-grain pasta. Brown rice. Bulgar wheat. Polenta. Couscous. Whole-wheat bread. [place]. Vegetables: Artichokes. Beets. Broccoli. Cabbage. Carrots. Eggplant. Green beans. Chard. Kale. Spinach. Onions. Leeks. Peas.  Squash. Tomatoes. Peppers. Radishes. Fruits: Apples. Apricots. Avocado. Berries. Bananas. Cherries. Dates. Figs. Grapes. Lemons. Melon. Oranges. Peaches. Plums. Pomegranate. Meats and other protein foods: Beans. Almonds. Sunflower seeds. Pine nuts. Peanuts. [place]. Salmon. Scallops. Shrimp. [place]. Tilapia. Clams. Oysters. Eggs. Dairy: Low-fat milk. Cheese. Greek yogurt. Beverages: Water. Red wine. Herbal tea. Fats and oils: Extra virgin olive oil. Avocado oil. Grape seed oil. Sweets and desserts: [place] yogurt with honey. Baked apples. Poached pears. Trail mix. Seasoning and other foods: Basil. Cilantro. Coriander. Cumin. Mint. Parsley. Sage. Rosemary. Tarragon. Garlic. Oregano. Thyme. Pepper. Balsalmic vinegar. Tahini. Hummus. Tomato sauce. Olives. Mushrooms. Limit these Grains: Prepackaged pasta or rice dishes. Prepackaged cereal with added sugar. Vegetables: Deep fried potatoes (french fries). Fruits: Fruit canned in syrup. Meats and other protein foods: Beef. Pork. Lamb. Poultry with skin. Hot dogs. Berniece Salines. Dairy: Ice cream. Sour cream. Whole milk. Beverages: Juice. Sugar-sweetened soft drinks. Beer. Liquor and spirits. Fats and oils: Butter. Canola oil. Vegetable oil. Beef fat (tallow). Lard. Sweets and desserts: Cookies. Cakes. Pies. Candy. Seasoning and other foods: Mayonnaise. Premade sauces and marinades. The items listed may not be a complete list. Talk with your dietitian about what dietary choices are right for you. Summary The Mediterranean diet includes both food and lifestyle choices. Eat a variety of fresh fruits and vegetables, beans, nuts, seeds, and whole grains. Limit the amount of red meat and sweets that you eat. Talk with your health care provider about whether it is safe for you to drink red wine in moderation. This means 1 glass a day for nonpregnant women and 2 glasses a day for men. A glass of wine equals 5 oz (150 mL). This information is not intended to replace advice  given to you by your health care provider. Make sure you discuss any questions you have with your health care provider. Document Released: 08/24/2015 Document Revised: 09/26/2015 Document Reviewed: 08/24/2015 Elsevier Interactive Patient Education  2017 [employer].

## 2021-02-16 DIAGNOSIS — F0280 Dementia in other diseases classified elsewhere without behavioral disturbance: Secondary | ICD-10-CM | POA: Insufficient documentation

## 2021-02-19 ENCOUNTER — Encounter (HOSPITAL_COMMUNITY): Payer: Self-pay

## 2021-02-19 ENCOUNTER — Emergency Department: Admission: RE | Admit: 2021-02-19 | Payer: Medicare PPO | Source: Ambulatory Visit

## 2021-02-19 ENCOUNTER — Emergency Department (HOSPITAL_COMMUNITY): Payer: Medicare PPO

## 2021-02-19 ENCOUNTER — Emergency Department (HOSPITAL_COMMUNITY)
Admission: EM | Admit: 2021-02-19 | Discharge: 2021-02-19 | Disposition: A | Discharge status: Home / Self Care | Payer: Medicare PPO | Attending: Emergency Medicine | Admitting: Emergency Medicine

## 2021-02-19 ENCOUNTER — Other Ambulatory Visit: Payer: Self-pay

## 2021-02-19 DIAGNOSIS — Z79899 Other long term (current) drug therapy: Secondary | ICD-10-CM | POA: Insufficient documentation

## 2021-02-19 DIAGNOSIS — Z20822 Contact with and (suspected) exposure to covid-19: Secondary | ICD-10-CM | POA: Insufficient documentation

## 2021-02-19 DIAGNOSIS — F0390 Unspecified dementia without behavioral disturbance: Secondary | ICD-10-CM | POA: Insufficient documentation

## 2021-02-19 DIAGNOSIS — Z91040 Latex allergy status: Secondary | ICD-10-CM | POA: Insufficient documentation

## 2021-02-19 DIAGNOSIS — R519 Headache, unspecified: Secondary | ICD-10-CM | POA: Diagnosis not present

## 2021-02-19 DIAGNOSIS — R202 Paresthesia of skin: Secondary | ICD-10-CM | POA: Diagnosis not present

## 2021-02-19 DIAGNOSIS — R42 Dizziness and giddiness: Secondary | ICD-10-CM | POA: Insufficient documentation

## 2021-02-19 HISTORY — DX: Essential (primary) hypertension: I10

## 2021-02-19 HISTORY — DX: Prediabetes: R73.03

## 2021-02-19 HISTORY — DX: Deficiency of other specified B group vitamins: E53.8

## 2021-02-19 LAB — URINALYSIS, ROUTINE W REFLEX MICROSCOPIC
Bilirubin Urine: NEGATIVE
Glucose, UA: NEGATIVE mg/dL
Hgb urine dipstick: NEGATIVE
Ketones, ur: NEGATIVE mg/dL
Leukocytes,Ua: NEGATIVE
Nitrite: NEGATIVE
Protein, ur: NEGATIVE mg/dL
Specific Gravity, Urine: 1.016 (ref 1.005–1.030)
pH: 7 (ref 5.0–8.0)

## 2021-02-19 LAB — COMPREHENSIVE METABOLIC PANEL
ALT: 17 U/L (ref 0–44)
AST: 28 U/L (ref 15–41)
Albumin: 4.3 g/dL (ref 3.5–5.0)
Alkaline Phosphatase: 52 U/L (ref 38–126)
Anion gap: 12 (ref 5–15)
BUN: 27 mg/dL — ABNORMAL HIGH (ref 8–23)
CO2: 26 mmol/L (ref 22–32)
Calcium: 10.6 mg/dL — ABNORMAL HIGH (ref 8.9–10.3)
Chloride: 102 mmol/L (ref 98–111)
Creatinine, Ser: 1.71 mg/dL — ABNORMAL HIGH (ref 0.44–1.00)
GFR, Estimated: 30 mL/min — ABNORMAL LOW (ref >60)
Glucose, Bld: 168 mg/dL — ABNORMAL HIGH (ref 70–99)
Potassium: 3.6 mmol/L (ref 3.5–5.1)
Sodium: 140 mmol/L (ref 135–145)
Total Bilirubin: 0.5 mg/dL (ref 0.3–1.2)
Total Protein: 7.3 g/dL (ref 6.5–8.1)

## 2021-02-19 LAB — ETHANOL: Alcohol, Ethyl (B): <10 mg/dL (ref <10)

## 2021-02-19 LAB — PROTIME-INR
INR: 1 (ref 0.8–1.2)
Prothrombin Time: 12.7 seconds (ref 11.4–15.2)

## 2021-02-19 LAB — APTT: aPTT: 26 seconds (ref 24–36)

## 2021-02-19 LAB — DIFFERENTIAL
Abs Immature Granulocytes: 0.02 10*3/uL (ref 0.00–0.07)
Basophils Absolute: 0 10*3/uL (ref 0.0–0.1)
Basophils Relative: 0 %
Eosinophils Absolute: 0.1 10*3/uL (ref 0.0–0.5)
Eosinophils Relative: 1 %
Immature Granulocytes: 0 %
Lymphocytes Relative: 42 %
Lymphs Abs: 2.8 10*3/uL (ref 0.7–4.0)
Monocytes Absolute: 0.6 10*3/uL (ref 0.1–1.0)
Monocytes Relative: 9 %
Neutro Abs: 3.2 10*3/uL (ref 1.7–7.7)
Neutrophils Relative %: 48 %

## 2021-02-19 LAB — RESP PANEL BY RT-PCR (FLU A&B, COVID) ARPGX2
Influenza A by PCR: NEGATIVE
Influenza B by PCR: NEGATIVE
SARS Coronavirus 2 by RT PCR: NEGATIVE

## 2021-02-19 LAB — I-STAT BETA HCG BLOOD, ED (MC, WL, AP ONLY): I-stat hCG, quantitative: 7.4 m[IU]/mL — ABNORMAL HIGH (ref <5)

## 2021-02-19 LAB — CBC
HCT: 43.6 % (ref 36.0–46.0)
Hemoglobin: 14.6 g/dL (ref 12.0–15.0)
MCH: 30.2 pg (ref 26.0–34.0)
MCHC: 33.5 g/dL (ref 30.0–36.0)
MCV: 90.3 fL (ref 80.0–100.0)
Platelets: 238 10*3/uL (ref 150–400)
RBC: 4.83 MIL/uL (ref 3.87–5.11)
RDW: 12.5 % (ref 11.5–15.5)
WBC: 6.8 10*3/uL (ref 4.0–10.5)
nRBC: 0 % (ref 0.0–0.2)

## 2021-02-19 MED ORDER — ACETAMINOPHEN 325 MG PO TABS
650.0000 mg | ORAL_TABLET | Freq: Once | ORAL | Status: AC
  Administered 2021-02-19: 650 mg via ORAL
  Filled 2021-02-19: qty 2

## 2021-02-19 NOTE — ED Notes (Signed)
Pt reports starting B12 supplements Saturday and having headaches, not feeling right and feeling weak since then

## 2021-02-19 NOTE — ED Provider Triage Note (Signed)
Emergency Medicine Provider Triage Evaluation Note  Anne Ford , a 81 y.o. female  was evaluated in triage.  Pt complains of "I had a stroke. Patient husband is at bedside who provides additional history. He states that yesterday morning patient had a headache and they had to leave church.  She went home, took an aspirin and then felt better however since then he has noticed that she has had slowed speech and gait. She has recently been seen by neurology for dementia and had her meds changed. She has a difficult time providing history, stating "I have had a stroke."  And getting very tearful and anxious.  Her husband feels like her symptoms have been getting worse since they started yesterday.    Physical Exam  BP (!) 134/58 (BP Location: Left Arm)    Pulse 74    Temp 98 F (36.7 C)    Resp 17    Ht 5\' 4"  (1.626 m)    Wt 65 kg    SpO2 99%    BMI 24.60 kg/m  Gen:   Awake, intermittently tearful Resp:  Normal effort  MSK:   Moves extremities without difficulty  Other:  Patient is able to tell when I am touching her bilateral arms or legs including her hands.  Her speech is slightly slowed and repetitive.  She is emotionally labile.  She states that she can feel the warmth when her husband touches her hands.  5/5 grip strength bilaterally.  Able to lift legs bilaterally without difficulty.  No facial droop, smile is symmetric.  Medical Decision Making  Medically screening exam initiated at 2:59 PM.  Appropriate orders placed.  Darolyn Double was informed that the remainder of the evaluation will be completed by another provider, this initial triage assessment does not replace that evaluation, and the importance of remaining in the ED until their evaluation is complete.  Patient symptoms started over 24 hours ago according to history provided by her husband in triage.  Based on that she is outside of any window for stroke, however will obtain similar work-up due to headache that is  resolved with change in mental status.  Additionally this may be due to a metabolic cause or change in her medications.   Meredith Pel, Cristina Gong 02/19/21 1502

## 2021-02-19 NOTE — ED Provider Notes (Signed)
[facility] EMERGENCY DEPARTMENT Provider Note   CSN: [id number] Arrival date & time: 02/19/21  1432     History  Chief Complaint  Patient presents with   Numbness    Anne Ford is a 81 y.o. female present emergency department with paresthesias of the hands and feet.  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  They report that she is started on memantine 3 months ago at 10 mg daily, and that this dose was increased 2 months ago to 10 mg twice daily.  She was also started on B12 vitamins about a month ago.  She reports that today she was taking a shower and she began to feel tingling in her bilateral hands and feet at the same time.  She felt lightheaded and woozy.  She called her husband who came up and helped her out of the shower, stating that she was "holding onto the bars like her legs were going to give out on her".  She felt very tired afterwards and laid down and took a nap.  She since then has had an intermittent headache sometimes in the left side of her head, which is sharp and lasts a second or 2 and goes away.  She has never had this count of symptoms before.  However she does have sometimes paresthesias in her feet.    HPI     Home Medications Prior to Admission medications   Medication Sig Start Date End Date Taking? Authorizing Provider  Acetaminophen (TYLENOL ARTHRITIS EXT RELIEF PO) Take 1 tablet by mouth as needed (pain).   Yes [provider]  atenolol (TENORMIN) 25 MG tablet Take 25 mg by mouth daily. 02/09/21  Yes [provider]  Cyanocobalamin (VITAMIN B-12 PO) Take 1 capsule by mouth daily. 02/15/21  Yes [provider]  felodipine (PLENDIL) 5 MG 24 hr tablet Take 5 mg by mouth daily. 02/09/21  Yes [provider]  fluticasone (FLONASE) 50 MCG/ACT nasal spray Place 2 sprays into both nostrils daily.  02/09/21  Yes [provider]  hydrochlorothiazide (HYDRODIURIL) 25 MG tablet Take 25 mg by mouth daily. Take at 6 pm 02/09/21  Yes [provider]  ibuprofen (ADVIL) 200 MG tablet Take 200 mg by mouth every 6 (six) hours as needed for mild pain or headache.   Yes [provider]  loratadine (CLARITIN) 10 MG tablet Take 10 mg by mouth daily.   Yes [provider]  memantine (NAMENDA) 10 MG tablet Take 10 mg by mouth 2 (two) times daily. 02/09/21  Yes [provider]  Polyvinyl Alcohol-Povidone (REFRESH OP) Apply 2 drops to eye 3 (three) times daily as needed (dryness).   Yes [provider]      Allergies    Codeine, No healthtouch food allergies, Pravastatin sodium, Amoxicillin-pot clavulanate, Latex, Morphine, and Neomycin-bacitracin-polymyxin [bacitracin-neomycin-polymyxin]    Review of Systems   Review of Systems  Physical Exam Updated Vital Signs BP (!) 158/78    Pulse 64    Temp 97.7 F (36.5 C) (Oral)    Resp 16    Ht 5\' 4"  (1.626 m)    Wt 65 kg    SpO2 97%    BMI 24.60 kg/m  Physical Exam Constitutional:      General: She is not in acute distress. HENT:     Head: Normocephalic and atraumatic.  Eyes:  Conjunctiva/sclera: Conjunctivae normal.     Pupils: Pupils are equal, round, and reactive to light.  Cardiovascular:     Rate and Rhythm: Normal rate and regular rhythm.  Pulmonary:     Effort: Pulmonary effort is normal. No respiratory distress.  Abdominal:     General: There is no distension.     Tenderness: There is no abdominal tenderness.  Skin:    General: Skin is warm and dry.  Neurological:     General: No focal deficit present.     Mental Status: She is alert and oriented to person, place, and time. Mental status is at baseline.     Cranial Nerves: No cranial nerve deficit.     Motor: No weakness.  Psychiatric:        Mood and Affect: Mood normal.        Behavior: Behavior normal.    ED Results /  Procedures / Treatments   Labs (all labs ordered are listed, but only abnormal results are displayed) Labs Reviewed  COMPREHENSIVE METABOLIC PANEL - Abnormal; Notable for the following components:      Result Value   Glucose, Bld 168 (*)    BUN 27 (*)    Creatinine, Ser 1.71 (*)    Calcium 10.6 (*)    GFR, Estimated 30 (*)    All other components within normal limits  URINALYSIS, ROUTINE W REFLEX MICROSCOPIC - Abnormal; Notable for the following components:   APPearance CLOUDY (*)    All other components within normal limits  I-STAT BETA HCG BLOOD, ED ([facility], [facility], [facility] ONLY) - Abnormal; Notable for the following components:   I-stat hCG, quantitative 7.4 (*)    All other components within normal limits  RESP PANEL BY RT-PCR (FLU A&B, COVID) ARPGX2  ETHANOL  PROTIME-INR  APTT  CBC  DIFFERENTIAL  RAPID URINE DRUG SCREEN, HOSP PERFORMED  I-STAT CHEM 8, ED    EKG EKG Interpretation  Date/Time:  Monday February 19 2021 14:43:47 EST Ventricular Rate:  73 PR Interval:  144 QRS Duration: 76 QT Interval:  410 QTC Calculation: 451 R Axis:   58 Text Interpretation: Normal sinus rhythm Normal ECG When compared with ECG of 16-Feb-2003 16:16, PREVIOUS ECG IS PRESENT Confirmed by Alvester Chou [id number] on 02/19/2021 7:36:35 PM  Radiology CT HEAD WO CONTRAST  Result Date: 02/19/2021 CLINICAL DATA:  Headache.  Paresthesias. EXAM: CT HEAD WITHOUT CONTRAST TECHNIQUE: Contiguous axial images were obtained from the base of the skull through the vertex without intravenous contrast. RADIATION DOSE REDUCTION: This exam was performed according to the departmental dose-optimization program which includes automated exposure control, adjustment of the mA and/or kV according to patient size and/or use of iterative reconstruction technique. COMPARISON:  December 22, 2020. FINDINGS: Brain: No evidence of acute infarction, hemorrhage, hydrocephalus, extra-axial collection or mass lesion/mass effect. Vascular: No  hyperdense vessel or unexpected calcification. Skull: Normal. Negative for fracture or focal lesion. Sinuses/Orbits: No acute finding. Other: None. IMPRESSION: No acute intracranial abnormality seen. Electronically Signed   By: Lupita Raider M.D.   On: 02/19/2021 15:52    Procedures Procedures    Medications Ordered in ED Medications  acetaminophen (TYLENOL) tablet 650 mg (650 mg Oral Given 02/19/21 2031)    ED Course/ Medical Decision Making/ A&P Clinical Course as of 02/19/21 2155  Mon Feb 19, 2021  2041 Urine sent now [MT]  2110 Reviewed patient's medical work-up with her family and her.  No evidence of COVID or UTI.  She feels otherwise well  and wants to go home.  I think this is reasonable.  We did have a discussion about her medications, they are requesting scaling back on her Namenda dosing back to 5 mg twice daily, which I think is reasonable at this point, as it is possible she is experienced side effects from this medication.  I would advise that they follow-up and discuss this with her neurologist as soon as possible.  They will need to get back in touch to reschedule her outpatient MRI.  At this time to however I do not see an indication for emergent MRI, and have a low suspicion for stroke, intracranial hemorrhage or bleed. [MT]    Clinical Course User Index [MT] Saryna Kneeland, Kermit Balo, MD                           Medical Decision Making Risk OTC drugs.   This patient presents to the ED with concern for generalized weakness, fatigue, paresthesias in the hands and feet.. This involves an extensive number of treatment options, and is a complaint that carries with it a high risk of complications and morbidity.  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  The pertinent results include: COVID and flu negative, no acute anemia, no evidence of significant dehydration on BMP, although there is some minor elevation both BUN and creatinine and GFR is 30, no significant electrolyte derangement, no evidence of UTI  I ordered imaging studies including CT scan of the head I independently visualized and interpreted imaging which showed no acute abnormalities, including intracranial mass I agree with the radiologist interpretation  The patient was maintained on a cardiac monitor.  I personally viewed and interpreted the cardiac monitored which showed an underlying rhythm of: Normal sinus rate  Per my interpretation the patient's ECG shows normal sinus rhythm without acute ischemic finding  I ordered medication including Tylenol for headache I have reviewed the patients home medicines and have made adjustments as needed  Test Considered:  -Patient clinical presentation, the lower suspicion for acute pneumonia, nor for stroke.  I do not believe the patient required emergent MRI imaging of the brain.  I have a low suspicion for meningitis did not feel she needed a lumbar puncture at this time.  She is clinically well-appearing, no fever, no photophobia, no nuchal rigidity.  After the interventions noted above, I reevaluated the patient and found that they have: improved.  Since arriving in the ER most of her paresthesias have resolved, and her headache is minimal  Dispostion:  After consideration of the diagnostic results and the patients response to treatment, I feel that the  patent would benefit from close outpatient neurology follow-up.         Final Clinical Impression(s) / ED Diagnoses Final diagnoses:  Paresthesia  Lightheadedness    Rx / DC Orders ED Discharge Orders     None         Larsen Dungan, Kermit Balo, MD 02/19/21  2155 ° °

## 2021-02-19 NOTE — ED Triage Notes (Signed)
Pt arrives POV for eval of blt arm numbness and L leg numbness. Pt reports subjective weakness to upper extremities, and L leg as well. All strength is equivalent on exam. No facial droop, no drift. Speech is slow w/ very slight aphasia. Per note, pt w/ hx of aphasia and was seen 2/2 for same. Reports onset of all sx 30 minutes PTA.

## 2021-02-19 NOTE — Discharge Instructions (Signed)
Please call to follow-up with your neurologist about your visit to the ER, including rescheduling your MRI.  You should talk about the symptoms that you had today.  I thought it was reasonable for you to decrease your dose of Namenda back to 5 mg twice a day; however you should discuss this with your neurologist as well.  It is not clear whether the symptoms you are having are side effects of this medication or caused by another medical condition.

## 2021-02-19 NOTE — ED Notes (Addendum)
Pt c/o bilateral hand numbness after taking a shower this morning and L temporal headache.  All symptoms have resolved.  Pt reports headaches on several different areas of her head over the last week.  Pt is being evaluated for dementia and was supposed to have a brain MRI today.  Pt has been taking Namenda and dosage was recently increased.

## 2021-02-20 LAB — RAPID URINE DRUG SCREEN, HOSP PERFORMED
Amphetamines: NOT DETECTED
Barbiturates: NOT DETECTED
Benzodiazepines: NOT DETECTED
Cocaine: NOT DETECTED
Opiates: NOT DETECTED
Tetrahydrocannabinol: NOT DETECTED

## 2021-03-03 ENCOUNTER — Ambulatory Visit
Admission: RE | Admit: 2021-03-03 | Discharge: 2021-03-03 | Disposition: A | Discharge status: Home / Self Care | Payer: Medicare PPO | Source: Ambulatory Visit | Attending: Physician Assistant | Admitting: Physician Assistant

## 2021-03-03 ENCOUNTER — Other Ambulatory Visit: Payer: Self-pay

## 2021-03-03 DIAGNOSIS — R413 Other amnesia: Secondary | ICD-10-CM

## 2021-03-03 DIAGNOSIS — I6782 Cerebral ischemia: Secondary | ICD-10-CM | POA: Diagnosis not present

## 2021-03-04 NOTE — Progress Notes (Signed)
Please, inform patient that the MRI results show chronic aging changes in the vessels and age related atrophy.No stroke, masses, fluid or infection is seen. Thank you so much!

## 2021-03-06 ENCOUNTER — Telehealth: Payer: Self-pay

## 2021-03-06 ENCOUNTER — Telehealth: Payer: Self-pay | Admitting: Physician Assistant

## 2021-03-06 NOTE — Telephone Encounter (Signed)
Husband called patient is having hallucinations, today, she is complaining of lip swelling, no swelling there per husband. She has made 3 trips to the ER. No new meds per husband. I recommended ER. Husband states,"patient is out of it". States wife says she is a having a stroke, he refused to listen to me, stated he would wait till her appt. I advised to call 911 and he still wouldn't listen to me. I asked Dimas Chyle, LPN to speak with husband and intervene.

## 2021-03-06 NOTE — Telephone Encounter (Signed)
Pt's husband called in stating the patient was having a reaction to her medication. I transferred the call to [place].

## 2021-03-06 NOTE — Telephone Encounter (Signed)
Spoke with pt husband and he stated at lunch his wife wasn't acting right and not able to do anything. Pt was advised to stop the memantine with her having the reaction. Pt husband stated that her symptoms started on Sunday she was having stroke like symptoms and was not able to go to church she was weak. Then on Monday she was the same way. Pt husband had to help give her a bath and dress her, Today pt has a complaint of weakness, head feeling heavy pt husband was advised to call 911 or have their son come and take her to the hospital to be evaluated. Pt husbanded stated that she will be evaluated Thursday when she sees her PCP. I told him no sir she needs to be seen today pt husband got mad and said she will be seen  by her PCP.

## 2021-03-08 DIAGNOSIS — H9201 Otalgia, right ear: Secondary | ICD-10-CM | POA: Diagnosis not present

## 2021-03-08 DIAGNOSIS — G309 Alzheimer's disease, unspecified: Secondary | ICD-10-CM | POA: Diagnosis not present

## 2021-03-08 DIAGNOSIS — R519 Headache, unspecified: Secondary | ICD-10-CM | POA: Diagnosis not present

## 2021-03-08 DIAGNOSIS — I10 Essential (primary) hypertension: Secondary | ICD-10-CM | POA: Diagnosis not present

## 2021-03-13 ENCOUNTER — Ambulatory Visit: Payer: Medicare PPO | Admitting: Neurology

## 2021-03-13 ENCOUNTER — Other Ambulatory Visit: Payer: Self-pay

## 2021-03-13 DIAGNOSIS — R202 Paresthesia of skin: Secondary | ICD-10-CM

## 2021-03-13 DIAGNOSIS — G629 Polyneuropathy, unspecified: Secondary | ICD-10-CM

## 2021-03-13 NOTE — Procedures (Signed)
[facility]  [place], [place]  [place], [place] [address] Tel: [phone] Fax:  [phone] Test Date:  03/13/2021  Patient: Anne Ford DOB: Mar 02, 1940 Physician: Narda Amber, DO  Sex: Female Height: 5\' 4"  Ref Phys: Sharene Butters, PA-C  ID#: [id number]   Technician:    Patient Complaints: This is an 81 year old female referred for evaluation of paresthesias involving the hands and feet.  NCV & EMG Findings: Extensive electrodiagnostic testing of the left upper and lower extremity shows:  All sensory responses including the left median, ulnar, mixed palmar, sural, and superficial peroneal nerves are within normal limits. All motor responses including the left median, ulnar, peroneal, and tibial motor responses are within normal limits. There is no evidence of active or chronic motor axonal loss changes affecting any of the tested muscles.  Motor unit configuration and recruitment pattern is within normal limits.  Impression: This is a normal study of the left upper and lower extremities.  In particular, there is no evidence of a large fiber sensorimotor polyneuropathy, cervical/lumbosacral radiculopathy, or carpal tunnel syndrome.   ___________________________ Narda Amber, DO    Nerve Conduction Studies Anti Sensory Summary Table   Stim Site NR Peak (ms) Norm Peak (ms) P-T Amp (V) Norm P-T Amp  Left Median Anti Sensory (2nd Digit)  33C  Wrist    3.3 <3.8 29.9 >10  Left Sup Peroneal Anti Sensory (Ant Lat Mall)  33C  12 cm    2.8 <4.6 7.1 >3  Left Sural Anti Sensory (Lat Mall)  33C  Calf    2.8 <4.6 12.0 >3  Left Ulnar Anti Sensory (5th Digit)  33C  Wrist    2.8 <3.2 28.4 >5   Motor Summary Table   Stim Site NR Onset (ms) Norm Onset (ms) O-P Amp (mV) Norm O-P Amp Site1 Site2 Delta-0 (ms) Dist (cm) Vel (m/s) Norm Vel (m/s)  Left Median Motor (Abd Poll Brev)  33C  Wrist    2.8 <4.0 7.4 >5 Elbow Wrist 4.8 26.0 54 >50  Elbow    7.6  7.0          Left Peroneal Motor (Ext Dig Brev)  33C  Ankle    4.8 <6.0 3.4 >2.5 B Fib Ankle 6.8 33.0 49 >40  B Fib    11.6  3.0  Poplt B Fib 1.4 7.0 50 >40  Poplt    13.0  2.9         Left Tibial Motor (Abd Hall Brev)  33C  Ankle    3.3 <6.0 7.9 >4 Knee Ankle 8.2 39.0 48 >40  Knee    11.5  7.6         Left Ulnar Motor (Abd Dig Minimi)  33C  Wrist    2.1 <3.1 7.3 >7 B Elbow Wrist 3.4 20.0 59 >50  B Elbow    5.5  7.2  A Elbow B Elbow 1.8 10.0 56 >50  A Elbow    7.3  7.2          Comparison Summary Table   Stim Site NR Peak (ms) Norm Peak (ms) P-T Amp (V) Site1 Site2 Delta-P (ms) Norm Delta (ms)  Left Median/Ulnar Palm Comparison (Wrist - 8cm)  33C  Median Palm    2.0 <2.2 42.8 Median Palm Ulnar Palm 0.3   Ulnar Palm    1.7 <2.2 27.5       EMG   Side Muscle Ins Act Fibs Psw Fasc Number Recrt Dur Dur. Amp  Amp. Poly Poly. Comment  Left AntTibialis Nml Nml Nml Nml Nml Nml Nml Nml Nml Nml Nml Nml N/A  Left Gastroc Nml Nml Nml Nml Nml Nml Nml Nml Nml Nml Nml Nml N/A  Left Flex Dig Long Nml Nml Nml Nml Nml Nml Nml Nml Nml Nml Nml Nml N/A  Left RectFemoris Nml Nml Nml Nml Nml Nml Nml Nml Nml Nml Nml Nml N/A  Left GluteusMed Nml Nml Nml Nml Nml Nml Nml Nml Nml Nml Nml Nml N/A  Left 1stDorInt Nml Nml Nml Nml Nml Nml Nml Nml Nml Nml Nml Nml N/A  Left PronatorTeres Nml Nml Nml Nml Nml Nml Nml Nml Nml Nml Nml Nml N/A  Left Biceps Nml Nml Nml Nml Nml Nml Nml Nml Nml Nml Nml Nml N/A  Left Triceps Nml Nml Nml Nml Nml Nml Nml Nml Nml Nml Nml Nml N/A  Left Deltoid Nml Nml Nml Nml Nml Nml Nml Nml Nml Nml Nml Nml N/A      Waveforms:

## 2021-03-14 NOTE — Progress Notes (Signed)
Please inform patient that the nerve conduction study is normal, thanks

## 2021-03-15 NOTE — Progress Notes (Signed)
Assessment/Plan:   Anne Ford is a very pleasant 81 y.o. year old RH female with  a history of hypertension, hyperlipidemia, vitamin D deficiency, prediabetes, anxiety, depression seen today for evaluation of memory loss and paresthesias. Last MoCA  was 15/30, with deficiencies in delayed recall 0/5, abstraction, naming 1/3, and visuospatial 3/5. Patient was  memantine 10 mg twice daily by PCP, now on half dose at 5 mg bid due to intolerance to higher dose.  Last CT of the head was remarkable for mild generalized cerebral and cerebellar atrophy without acute intracranial abnormalities.   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  Discussed the importance of regular daily schedule with inclusion of crossword puzzles to maintain brain function.  Continue to monitor mood with PCP.  Stay active at least 30 minutes at least 3 times a week.  Naps should be scheduled and should be no longer than 60 minutes and should not occur after 2 PM.  Control cardiovascular risk factors  Mediterranean diet is recommended  Folllow up  in 1 month   Paresthesias  Patient complains of bilateral paresthesias, worse from the elbow down to the hands, and also on the feet bilaterally, left greater than right.  She denies any prior history of a stroke.  Exam and work-up essentially unremarkable for stroke findings, she does show some slow speech, but according to her family this has improved  NCV-EMG was negative without evidence of a large fiber sensorimotor polyneuropathy, cervical/lumbosacral radiculopathy, or carpal tunnel syndrome. Se was seen at the ED with same complaints on 02/19/21, negative workup  Follow-up with PCP, consider peripheral vascular workup      Subjective:   This is a very pleasant 81 year old right-handed woman seen in an earlier than scheduled visit after  presentation to the emergency department with strokelike symptoms, but with negative work-up.  She was in the shower, and felt like "my face in my feet were drawing, and reminded me of the stroke ".  Her husband suspected that perhaps it was not a stroke, but it was related to her memantine, which was on memantine 10 mg twice daily, and 3 days later, she had those symptoms.  These lasted a few hours, by the evening was "seminormal, but then got better ".  Memantine since then has been reduced to 5 mg twice daily, without any issues.  She is tolerating it well.  Of note, during the visit to the ED, she was found to have sinusitis, and she was placed on doxycycline and prednisone, finishing the course, and having improved symptoms. Her speech continues to be slow, but the patient reports that this is better than prior.  She continues to read and do crossword puzzles and word finding.  She also reports being more organized since being placed on memantine.  She places names and calendars not to forget appointments.  She sleeps well, denies vivid dreams, sleepwalking, hallucinations or paranoia.  She is independent of bathing and dressing, there are no hygiene concerns.  She places medications in a pillbox.  Her husband is in charge of the finances.  Her appetite is good, denies trouble swallowing, she does not cook very often "a little'.  She ambulates without a cane, denies any falls since her last visit.  She denies any headaches, double vision, or dizziness.  She has bilateral left hand tingling, at times  painful to touch, left greater than right, as well as bilateral feet "feeling as if I have issue lays ".  She denies any focal numbness.  Of note, she had a EMG NCV study which was completely normal.  Denies urine frequency, but does have mild incontinence, denies constipation or diarrhea.   Initial visit 02/15/21  The patient is seen in neurologic consultation at the request of Wenda Low, MD for the  evaluation of memory.  The patient is accompanied by  who supplements the history. This is a 81 y.o. year old RH  female who has had memory issues for about 1.5 years, worse over the last 2 months.  During the last Thanksgiving, her family noticed that she was having worsening of memory and speech disturbance.  Church members began to notice as well, when she was repeating the same stories, asking the same questions.  Her PCP placed her on memantine 5 mg daily, then increasing it to 10 mg daily which "for a while it helped especially with the slurring ", but over the last 2 weeks, he increase it to 10 mg twice daily without significant improvement.  Her speech continues to be slow, but family reports that this is better than prior.  She becomes very upset because she has always been very organized, in charge of every document in the house, and now she is having problems and trying to stay focused and organized.  She denies being confused when entering the room, or leaving objects in unusual places.  She continues to drive but short distances, because over the last 6 months, if not using the GPS she will become lost if driving to unknown locations.  This has brought significant amount of frustration on her.  She becomes tearful at times, when she thinks that she could have Alzheimer's disease, as she took care of her mother with Alzheimer's when she was 72 years old.  She continues to read and do crossword puzzles and word findings.  She sleeps well, denies vivid dreams, sleepwalking, hallucinations or paranoia.  She is independent of bathing and dressing, there are no hygiene concerns.  She puts the medications on a pillbox (she could not think of the word "pillbox "), and checks a calendar frequently did not forget appointments.  Her husband has taken over the finances since last month.  Her appetite is good, denies trouble swallowing.  She does not cook very often.  She ambulates without a cane, but she had a  couple of falls, one in June due to heat exhaustion resulting in presyncope, and another mechanical fall hitting the right side of her head in November 2022 without loss of consciousness.  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  She denies focal numbness.  She denies a history of stroke.  She denies using the computer on excessive basis.  Denies hormonal therapy, or long distance trips.  Denies chest pain or palpitations.  Denies shortness of breath.  Denies a history of tremors or anosmia.  No history of seizures.  Denies urine frequency, but she does have mild incontinence.  Denies constipation or diarrhea.  Denies a history of sleep apnea, alcohol or tobacco.  States she has strong family history of Alzheimer's disease in  mother, 2 sisters and maternal grandmother.   Recent labs 02/09/2021 shows abnormal lipid panel with TC 253, triglycerides 218, LDL 154 CMP calcium 10.8, creatinine 1.35. CBC was normal  CT scan of the head 12/22/2020 remarkable for no intracranial abnormality. Mild generalized cerebral and cerebellar atrophy.  Mild right ethmoid sinus mucosal thickening.  MRI brain 03/03/21 No acute or reversible finding. Generalized age related volume loss without lobar predominance. Mild chronic small-vessel ischemic changes of the pons and cerebral hemispheric white matter.  CT head 2/6/23No acute intracranial abnormality seen.    Allergies  Allergen Reactions   Codeine Other (See Comments)    GI upset   Fluocinonide Other (See Comments)   Morphine Sulfate Other (See Comments)   No [employer] Food Allergies Swelling    Shrimp : swelling of tongue and lips per pt and spouse   Pravastatin Other (See Comments)   Pravastatin Sodium  Other (See Comments)    Joint pain   Amoxicillin-Pot Clavulanate Nausea Only and Nausea And Vomiting   Latex Rash   Morphine Other (See Comments)    Other Reaction: CNS Disorder Other Reaction: CNS Disorder    Neomycin-Bacitracin-Polymyxin [Bacitracin-Neomycin-Polymyxin] Rash   Rosuvastatin Rash    Current Outpatient Medications  Medication Instructions   Acetaminophen (TYLENOL ARTHRITIS EXT RELIEF PO) 1 tablet, Oral, As needed   atenolol (TENORMIN) 25 mg, Oral, Daily   Cyanocobalamin (VITAMIN B-12 PO) 1 capsule, Oral, Daily   felodipine (PLENDIL) 5 mg, Oral, Daily   fluticasone (FLONASE) 50 MCG/ACT nasal spray 2 sprays, Each Nare, Daily   hydrochlorothiazide (HYDRODIURIL) 25 mg, Oral, Daily, Take at 6 pm   ibuprofen (ADVIL) 200 mg, Oral, Every 6 hours PRN   loratadine (CLARITIN) 10 mg, Oral, Daily   memantine (NAMENDA) 10 mg, Oral, 2 times daily   Polyvinyl Alcohol-Povidone (REFRESH OP) 2 drops, Ophthalmic, 3 times daily PRN     VITALS:   Vitals:   03/16/21 1123  BP: (!) 145/67  Pulse: 62  Resp: 18  SpO2: 98%  Weight: 144 lb (65.3 kg)  Height: 5\' 3"  (1.6 m)    No flowsheet data found.  PHYSICAL EXAM   HEENT:  Normocephalic, atraumatic. The mucous membranes are moist. The superficial temporal arteries are without ropiness or tenderness. Cardiovascular: Regular rate and rhythm. Lungs: Clear to auscultation bilaterally. Neck: There are no carotid bruits noted bilaterally.  NEUROLOGICAL: Montreal Cognitive Assessment  02/16/2021  Visuospatial/ Executive (0/5) 3  Naming (0/3) 1  Attention: Read list of digits (0/2) 2  Attention: Read list of letters (0/1) 1  Attention: Serial 7 subtraction starting at 100 (0/3) 0  Language: Repeat phrase (0/2) 2  Language : Fluency (0/1) 1  Abstraction (0/2) 0  Delayed Recall (0/5) 0  Orientation (0/6) 5  Total 15  Adjusted Score (based on education) 15   No flowsheet data found.  No flowsheet data found.   Orientation:   Alert and oriented to person, place and time. No aphasia or dysarthria, but she does demonstrate slower speech. Fund of knowledge is appropriate. Recent memory impaired and remote memory intact.  Attention and concentration are reduced.  Able to name objects  and to repeat phrases.   Cranial nerves: There is good facial symmetry. Extraocular muscles are intact and visual fields are full to confrontational testing. Speech is slow and clear. Soft palate rises symmetrically and there is no tongue deviation. Hearing is intact to conversational tone.  No dystonia or tardive dyskinesia noted Tone: Tone is good throughout.  No  cogwheel. Sensation: Sensation is intact to light touch and pinprick throughout. Vibration is intact at the bilateral big toe.There is no extinction with double simultaneous stimulation. There is no sensory dermatomal level identified. Coordination: The patient has no difficulty with RAM's or FNF bilaterally. Normal finger to nose  Motor: Strength is 5/5 in the bilateral upper and lower extremities. There is no pronator drift. There are no fasciculations noted. DTR's: Deep tendon reflexes are 2/4 at the bilateral biceps, triceps, brachioradialis, patella and achilles.  Plantar responses are downgoing bilaterally. Gait and Station: The patient is able to ambulate without difficulty, but she does complain of "feet feeling weird when walking ".The patient is able to heel toe walk without any difficulty.The patient is able to ambulate in a tandem fashion. The patient is able to stand in the Romberg position.     Thank you for allowing [place] the opportunity to participate in the care of this nice patient. Please do not hesitate to contact us for any questions or concerns.   Total time spent on today's visit was 70 minutes, including both face-to-face time and nonface-to-face time.  Time included that spent on review of records (prior notes available to me/labs/imaging if pertinent), discussing  treatment and goals, answering patient's questions and coordinating care.  Cc:  Wenda Low, MD  Sharene Butters 03/17/2021 1:08 PM

## 2021-03-16 ENCOUNTER — Encounter: Payer: Self-pay | Admitting: Physician Assistant

## 2021-03-16 ENCOUNTER — Other Ambulatory Visit: Payer: Self-pay

## 2021-03-16 ENCOUNTER — Ambulatory Visit: Payer: Medicare PPO | Admitting: Physician Assistant

## 2021-03-16 VITALS — BP 145/67 | HR 62 | Resp 18 | Ht 63.0 in | Wt 144.0 lb

## 2021-03-16 DIAGNOSIS — G309 Alzheimer's disease, unspecified: Secondary | ICD-10-CM | POA: Diagnosis not present

## 2021-03-16 DIAGNOSIS — F0280 Dementia in other diseases classified elsewhere without behavioral disturbance: Secondary | ICD-10-CM

## 2021-03-16 NOTE — Patient Instructions (Signed)
It was a pleasure to see you today at our office.   Recommendations:  Continue Memantine 5 mg  2 times a day  Follow up 6  month   RECOMMENDATIONS FOR ALL PATIENTS WITH MEMORY PROBLEMS: 1. Continue to exercise (Recommend 30 minutes of walking everyday, or 3 hours every week) 2. Increase social interactions - continue going to [place] and enjoy social gatherings with friends and family 3. Eat healthy, avoid fried foods and eat more fruits and vegetables 4. Maintain adequate blood pressure, blood sugar, and blood cholesterol level. Reducing the risk of stroke and cardiovascular disease also helps promoting better memory. 5. Avoid stressful situations. Live a simple life and avoid aggravations. Organize your time and prepare for the next day in anticipation. 6. Sleep well, avoid any interruptions of sleep and avoid any distractions in the bedroom that may interfere with adequate sleep quality 7. Avoid sugar, avoid sweets as there is a strong link between excessive sugar intake, diabetes, and cognitive impairment We discussed the Mediterranean diet, which has been shown to help patients reduce the risk of progressive memory disorders and reduces cardiovascular risk. This includes eating fish, eat fruits and green leafy vegetables, nuts like almonds and hazelnuts, walnuts, and also use olive oil. Avoid fast foods and fried foods as much as possible. Avoid sweets and sugar as sugar use has been linked to worsening of memory function.  There is always a concern of gradual progression of memory problems. If this is the case, then we may need to adjust level of care according to patient needs. Support, both to the patient and caregiver, should then be put into place.     FALL PRECAUTIONS: Be cautious when walking. Scan the area for obstacles that may increase the risk of trips and falls. When getting up in the mornings, sit up at the edge of the bed for a few minutes before getting out of bed. Consider  elevating the bed at the head end to avoid drop of blood pressure when getting up. Walk always in a well-lit room (use night lights in the walls). Avoid area rugs or power cords from appliances in the middle of the walkways. Use a walker or a cane if necessary and consider physical therapy for balance exercise. Get your eyesight checked regularly.  FINANCIAL OVERSIGHT: Supervision, especially oversight when making financial decisions or transactions is also recommended.  HOME SAFETY: Consider the safety of the kitchen when operating appliances like stoves, microwave oven, and blender. Consider having supervision and share cooking responsibilities until no longer able to participate in those. Accidents with firearms and other hazards in the house should be identified and addressed as well.   ABILITY TO BE LEFT ALONE: If patient is unable to contact 911 operator, consider using LifeLine, or when the need is there, arrange for someone to stay with patients. Smoking is a fire hazard, consider supervision or cessation. Risk of wandering should be assessed by caregiver and if detected at any point, supervision and safe proof recommendations should be instituted.  MEDICATION SUPERVISION: Inability to self-administer medication needs to be constantly addressed. Implement a mechanism to ensure safe administration of the medications.   DRIVING: Regarding driving, in patients with progressive memory problems, driving will be impaired. We advise to have someone else do the driving if trouble finding directions or if minor accidents are reported. Independent driving assessment is available to determine safety of driving.   If you are interested in the driving assessment, you can contact the following:  [facility] [employer] [facility] in [place] [phone]  [facility] [phone]  [facility] [phone] or [phone]    Mediterranean  Diet A Mediterranean diet refers to food and lifestyle choices that are based on the traditions of countries located on the [employer]. This way of eating has been shown to help prevent certain conditions and improve outcomes for people who have chronic diseases, like kidney disease and heart disease. What are tips for following this plan? Lifestyle  Cook and eat meals together with your family, when possible. Drink enough fluid to keep your urine clear or pale yellow. Be physically active every day. This includes: Aerobic exercise like running or swimming. Leisure activities like gardening, walking, or housework. Get 7-8 hours of sleep each night. If recommended by your health care provider, drink red wine in moderation. This means 1 glass a day for nonpregnant women and 2 glasses a day for men. A glass of wine equals 5 oz (150 mL). Reading food labels  Check the serving size of packaged foods. For foods such as rice and pasta, the serving size refers to the amount of cooked product, not dry. Check the total fat in packaged foods. Avoid foods that have saturated fat or trans fats. Check the ingredients list for added sugars, such as corn syrup. Shopping  At the grocery store, buy most of your food from the areas near the walls of the store. This includes: Fresh fruits and vegetables (produce). Grains, beans, nuts, and seeds. Some of these may be available in unpackaged forms or large amounts (in bulk). Fresh seafood. Poultry and eggs. Low-fat dairy products. Buy whole ingredients instead of prepackaged foods. Buy fresh fruits and vegetables in-season from local farmers markets. Buy frozen fruits and vegetables in resealable bags. If you do not have access to quality fresh seafood, buy precooked frozen shrimp or canned fish, such as tuna, salmon, or sardines. Buy small amounts of raw or cooked vegetables, salads, or olives from the deli or salad bar at your store. Stock your pantry  so you always have certain foods on hand, such as olive oil, canned tuna, canned tomatoes, rice, pasta, and beans. Cooking  Cook foods with extra-virgin olive oil instead of using butter or other vegetable oils. Have meat as a side dish, and have vegetables or grains as your main dish. This means having meat in small portions or adding small amounts of meat to foods like pasta or stew. Use beans or vegetables instead of meat in common dishes like chili or lasagna. Experiment with different cooking methods. Try roasting or broiling vegetables instead of steaming or sauteing them. Add frozen vegetables to soups, stews, pasta, or rice. Add nuts or seeds for added healthy fat at each meal. You can add these to yogurt, salads, or vegetable dishes. Marinate fish or vegetables using olive oil, lemon juice, garlic, and fresh herbs. Meal planning  Plan to eat 1 vegetarian meal one day each week. Try to work up to 2 vegetarian meals, if possible. Eat seafood 2 or more times a week. Have healthy snacks readily available, such as: Vegetable sticks with hummus. Greek yogurt. Fruit and nut trail mix. Eat balanced meals throughout the week. This includes: Fruit: 2-3 servings a day Vegetables: 4-5 servings a day Low-fat dairy: 2 servings a day Fish, poultry, or lean meat: 1 serving a day Beans and legumes: 2 or more servings a week Nuts and seeds: 1-2 servings a day Whole grains: 6-8 servings  a day Extra-virgin olive oil: 3-4 servings a day Limit red meat and sweets to only a few servings a month What are my food choices? Mediterranean diet Recommended Grains: Whole-grain pasta. Brown rice. Bulgar wheat. Polenta. Couscous. Whole-wheat bread. Orpah Cobb. Vegetables: Artichokes. Beets. Broccoli. Cabbage. Carrots. Eggplant. Green beans. Chard. Kale. Spinach. Onions. Leeks. Peas. Squash. Tomatoes. Peppers. Radishes. Fruits: Apples. Apricots. Avocado. Berries. Bananas. Cherries. Dates. Figs. Grapes.  Lemons. Melon. Oranges. Peaches. Plums. Pomegranate. Meats and other protein foods: Beans. Almonds. Sunflower seeds. Pine nuts. Peanuts. Cod. Salmon. Scallops. Shrimp. Tuna. Tilapia. Clams. Oysters. Eggs. Dairy: Low-fat milk. Cheese. Greek yogurt. Beverages: Water. Red wine. Herbal tea. Fats and oils: Extra virgin olive oil. Avocado oil. Grape seed oil. Sweets and desserts: [place] yogurt with honey. Baked apples. Poached pears. Trail mix. Seasoning and other foods: Basil. Cilantro. Coriander. Cumin. Mint. Parsley. Sage. Rosemary. Tarragon. Garlic. Oregano. Thyme. Pepper. Balsalmic vinegar. Tahini. Hummus. Tomato sauce. Olives. Mushrooms. Limit these Grains: Prepackaged pasta or rice dishes. Prepackaged cereal with added sugar. Vegetables: Deep fried potatoes (french fries). Fruits: Fruit canned in syrup. Meats and other protein foods: Beef. Pork. Lamb. Poultry with skin. Hot dogs. Tomasa Blase. Dairy: Ice cream. Sour cream. Whole milk. Beverages: Juice. Sugar-sweetened soft drinks. Beer. Liquor and spirits. Fats and oils: Butter. Canola oil. Vegetable oil. Beef fat (tallow). Lard. Sweets and desserts: Cookies. Cakes. Pies. Candy. Seasoning and other foods: Mayonnaise. Premade sauces and marinades. The items listed may not be a complete list. Talk with your dietitian about what dietary choices are right for you. Summary The Mediterranean diet includes both food and lifestyle choices. Eat a variety of fresh fruits and vegetables, beans, nuts, seeds, and whole grains. Limit the amount of red meat and sweets that you eat. Talk with your health care provider about whether it is safe for you to drink red wine in moderation. This means 1 glass a day for nonpregnant women and 2 glasses a day for men. A glass of wine equals 5 oz (150 mL). This information is not intended to replace advice given to you by your health care provider. Make sure you discuss any questions you have with your health care  provider. Document Released: 08/24/2015 Document Revised: 09/26/2015 Document Reviewed: 08/24/2015 Elsevier Interactive Patient Education  2017 [employer].

## 2021-03-22 ENCOUNTER — Encounter: Payer: Medicare PPO | Admitting: Neurology

## 2021-03-29 DIAGNOSIS — I10 Essential (primary) hypertension: Secondary | ICD-10-CM | POA: Diagnosis not present

## 2021-05-28 DIAGNOSIS — G309 Alzheimer's disease, unspecified: Secondary | ICD-10-CM | POA: Diagnosis not present

## 2021-05-28 DIAGNOSIS — N1831 Chronic kidney disease, stage 3a: Secondary | ICD-10-CM | POA: Diagnosis not present

## 2021-05-28 DIAGNOSIS — I10 Essential (primary) hypertension: Secondary | ICD-10-CM | POA: Diagnosis not present

## 2021-07-16 DIAGNOSIS — Z85828 Personal history of other malignant neoplasm of skin: Secondary | ICD-10-CM | POA: Diagnosis not present

## 2021-07-16 DIAGNOSIS — D2261 Melanocytic nevi of right upper limb, including shoulder: Secondary | ICD-10-CM | POA: Diagnosis not present

## 2021-07-16 DIAGNOSIS — L821 Other seborrheic keratosis: Secondary | ICD-10-CM | POA: Diagnosis not present

## 2021-07-16 DIAGNOSIS — D2272 Melanocytic nevi of left lower limb, including hip: Secondary | ICD-10-CM | POA: Diagnosis not present

## 2021-07-16 DIAGNOSIS — D2262 Melanocytic nevi of left upper limb, including shoulder: Secondary | ICD-10-CM | POA: Diagnosis not present

## 2021-07-16 DIAGNOSIS — L668 Other cicatricial alopecia: Secondary | ICD-10-CM | POA: Diagnosis not present

## 2021-08-27 DIAGNOSIS — G309 Alzheimer's disease, unspecified: Secondary | ICD-10-CM | POA: Diagnosis not present

## 2021-08-27 DIAGNOSIS — R7303 Prediabetes: Secondary | ICD-10-CM | POA: Diagnosis not present

## 2021-08-27 DIAGNOSIS — N1831 Chronic kidney disease, stage 3a: Secondary | ICD-10-CM | POA: Diagnosis not present

## 2021-08-27 DIAGNOSIS — I10 Essential (primary) hypertension: Secondary | ICD-10-CM | POA: Diagnosis not present

## 2021-09-07 ENCOUNTER — Other Ambulatory Visit: Payer: Self-pay | Admitting: Internal Medicine

## 2021-09-07 DIAGNOSIS — Z1231 Encounter for screening mammogram for malignant neoplasm of breast: Secondary | ICD-10-CM

## 2021-09-19 ENCOUNTER — Encounter: Payer: Self-pay | Admitting: Physician Assistant

## 2021-09-19 ENCOUNTER — Ambulatory Visit: Payer: Medicare PPO | Admitting: Physician Assistant

## 2021-09-19 VITALS — BP 130/57 | HR 66 | Resp 18 | Ht 63.0 in | Wt 148.0 lb

## 2021-09-19 DIAGNOSIS — F0280 Dementia in other diseases classified elsewhere without behavioral disturbance: Secondary | ICD-10-CM

## 2021-09-19 DIAGNOSIS — G309 Alzheimer's disease, unspecified: Secondary | ICD-10-CM | POA: Diagnosis not present

## 2021-09-19 NOTE — Patient Instructions (Signed)
It was a pleasure to see you today at our office.   Recommendations:  Continue Memantine 5 mg  2 times a day  Follow up 6  month   RECOMMENDATIONS FOR ALL PATIENTS WITH MEMORY PROBLEMS: 1. Continue to exercise (Recommend 30 minutes of walking everyday, or 3 hours every week) 2. Increase social interactions - continue going to [place] and enjoy social gatherings with friends and family 3. Eat healthy, avoid fried foods and eat more fruits and vegetables 4. Maintain adequate blood pressure, blood sugar, and blood cholesterol level. Reducing the risk of stroke and cardiovascular disease also helps promoting better memory. 5. Avoid stressful situations. Live a simple life and avoid aggravations. Organize your time and prepare for the next day in anticipation. 6. Sleep well, avoid any interruptions of sleep and avoid any distractions in the bedroom that may interfere with adequate sleep quality 7. Avoid sugar, avoid sweets as there is a strong link between excessive sugar intake, diabetes, and cognitive impairment We discussed the Mediterranean diet, which has been shown to help patients reduce the risk of progressive memory disorders and reduces cardiovascular risk. This includes eating fish, eat fruits and green leafy vegetables, nuts like almonds and hazelnuts, walnuts, and also use olive oil. Avoid fast foods and fried foods as much as possible. Avoid sweets and sugar as sugar use has been linked to worsening of memory function.  There is always a concern of gradual progression of memory problems. If this is the case, then we may need to adjust level of care according to patient needs. Support, both to the patient and caregiver, should then be put into place.     FALL PRECAUTIONS: Be cautious when walking. Scan the area for obstacles that may increase the risk of trips and falls. When getting up in the mornings, sit up at the edge of the bed for a few minutes before getting out of bed. Consider  elevating the bed at the head end to avoid drop of blood pressure when getting up. Walk always in a well-lit room (use night lights in the walls). Avoid area rugs or power cords from appliances in the middle of the walkways. Use a walker or a cane if necessary and consider physical therapy for balance exercise. Get your eyesight checked regularly.  FINANCIAL OVERSIGHT: Supervision, especially oversight when making financial decisions or transactions is also recommended.  HOME SAFETY: Consider the safety of the kitchen when operating appliances like stoves, microwave oven, and blender. Consider having supervision and share cooking responsibilities until no longer able to participate in those. Accidents with firearms and other hazards in the house should be identified and addressed as well.   ABILITY TO BE LEFT ALONE: If patient is unable to contact 911 operator, consider using LifeLine, or when the need is there, arrange for someone to stay with patients. Smoking is a fire hazard, consider supervision or cessation. Risk of wandering should be assessed by caregiver and if detected at any point, supervision and safe proof recommendations should be instituted.  MEDICATION SUPERVISION: Inability to self-administer medication needs to be constantly addressed. Implement a mechanism to ensure safe administration of the medications.   DRIVING: Regarding driving, in patients with progressive memory problems, driving will be impaired. We advise to have someone else do the driving if trouble finding directions or if minor accidents are reported. Independent driving assessment is available to determine safety of driving.   If you are interested in the driving assessment, you can contact the following:  [employer] in [place]  [facility] [place] [phone] or [phone]    [place] refers to food and lifestyle choices that are based on the traditions of countries located on the [employer]. This way of eating has been shown to help prevent certain conditions and improve outcomes for people who have chronic diseases, like kidney disease and heart disease. What are tips for following this plan? Lifestyle  Cook and eat meals together with your family, when possible. Drink enough fluid to keep your urine clear or pale yellow. Be physically active every day. This includes: Aerobic exercise like running or swimming. Leisure activities like gardening, walking, or housework. Get 7-8 hours of sleep each night. If recommended by your health care provider, drink red wine in moderation. This means 1 glass a day for nonpregnant women and 2 glasses a day for men. A glass of wine equals 5 oz (150 mL). Reading food labels  Check the serving size of packaged foods. For foods such as rice and pasta, the serving size refers to the amount of cooked product, not dry. Check the total fat in packaged foods. Avoid foods that have saturated fat or trans fats. Check the ingredients list for added sugars, such as corn syrup. Shopping  At the grocery store, buy most of your food from the areas near the walls of the store. This includes: Fresh fruits and vegetables (produce). Grains, beans, nuts, and seeds. Some of these may be available in unpackaged forms or large amounts (in bulk). Fresh seafood. Poultry and eggs. Low-fat dairy products. Buy whole ingredients instead of prepackaged foods. Buy fresh fruits and vegetables in-season from local farmers markets. Buy frozen fruits and vegetables in resealable bags. If you do not have access to quality fresh seafood, buy precooked frozen shrimp or canned fish, such as tuna, salmon, or sardines. Buy small amounts of raw or cooked vegetables, salads, or olives from the deli or salad bar at your store. Stock your pantry  so you always have certain foods on hand, such as olive oil, canned tuna, canned tomatoes, rice, pasta, and beans. Cooking  Cook foods with extra-virgin olive oil instead of using butter or other vegetable oils. Have meat as a side dish, and have vegetables or grains as your main dish. This means having meat in small portions or adding small amounts of meat to foods like pasta or stew. Use beans or vegetables instead of meat in common dishes like chili or lasagna. Experiment with different cooking methods. Try roasting or broiling vegetables instead of steaming or sauteing them. Add frozen vegetables to soups, stews, pasta, or rice. Add nuts or seeds for added healthy fat at each meal. You can add these to yogurt, salads, or vegetable dishes. Marinate fish or vegetables using olive oil, lemon juice, garlic, and fresh herbs. Meal planning  Plan to eat 1 vegetarian meal one day each week. Try to work up to 2 vegetarian meals, if possible. Eat seafood 2 or more times a week. Have healthy snacks readily available, such as: Vegetable sticks with hummus. Greek yogurt. Fruit and nut trail mix. Eat balanced meals throughout the week. This includes: Fruit: 2-3 servings a day Vegetables: 4-5 servings a day Low-fat dairy: 2 servings a day Fish, poultry, or lean meat: 1 serving a day Beans and legumes: 2 or more servings a week Nuts and seeds: 1-2 servings a day Whole grains: 6-8 servings  a day Extra-virgin olive oil: 3-4 servings a day Limit red meat and sweets to only a few servings a month What are my food choices? Mediterranean diet Recommended Grains: Whole-grain pasta. Brown rice. Bulgar wheat. Polenta. Couscous. Whole-wheat bread. [place]. Vegetables: Artichokes. Beets. Broccoli. Cabbage. Carrots. Eggplant. Green beans. Chard. Kale. Spinach. Onions. Leeks. Peas. Squash. Tomatoes. Peppers. Radishes. Fruits: Apples. Apricots. Avocado. Berries. Bananas. Cherries. Dates. Figs. Grapes.  Lemons. Melon. Oranges. Peaches. Plums. Pomegranate. Meats and other protein foods: Beans. Almonds. Sunflower seeds. Pine nuts. Peanuts. [place]. Salmon. Scallops. Shrimp. [place]. Tilapia. Clams. Oysters. Eggs. Dairy: Low-fat milk. Cheese. Greek yogurt. Beverages: Water. Red wine. Herbal tea. Fats and oils: Extra virgin olive oil. Avocado oil. Grape seed oil. Sweets and desserts: [place] yogurt with honey. Baked apples. Poached pears. Trail mix. Seasoning and other foods: Basil. Cilantro. Coriander. Cumin. Mint. Parsley. Sage. Rosemary. Tarragon. Garlic. Oregano. Thyme. Pepper. Balsalmic vinegar. Tahini. Hummus. Tomato sauce. Olives. Mushrooms. Limit these Grains: Prepackaged pasta or rice dishes. Prepackaged cereal with added sugar. Vegetables: Deep fried potatoes (french fries). Fruits: Fruit canned in syrup. Meats and other protein foods: Beef. Pork. Lamb. Poultry with skin. Hot dogs. [place] Salines. Dairy: Ice cream. Sour cream. Whole milk. Beverages: Juice. Sugar-sweetened soft drinks. Beer. Liquor and spirits. Fats and oils: Butter. Canola oil. Vegetable oil. Beef fat (tallow). Lard. Sweets and desserts: Cookies. Cakes. Pies. Candy. Seasoning and other foods: Mayonnaise. Premade sauces and marinades. The items listed may not be a complete list. Talk with your dietitian about what dietary choices are right for you. Summary The Mediterranean diet includes both food and lifestyle choices. Eat a variety of fresh fruits and vegetables, beans, nuts, seeds, and whole grains. Limit the amount of red meat and sweets that you eat. Talk with your health care provider about whether it is safe for you to drink red wine in moderation. This means 1 glass a day for nonpregnant women and 2 glasses a day for men. A glass of wine equals 5 oz (150 mL). This information is not intended to replace advice given to you by your health care provider. Make sure you discuss any questions you have with your health care  provider. Document Released: 08/24/2015 Document Revised: 09/26/2015 Document Reviewed: 08/24/2015 Elsevier Interactive Patient Education  2017 [employer].

## 2021-09-19 NOTE — Progress Notes (Signed)
Assessment/Plan:   Mixed vascular and Alzheimer's disease  Anne Ford is a very pleasant 81 y.o. RH female with a history of hypertension, hyperlipidemia, vitamin D deficiency, prediabetes, anxiety, depression, paresthesias despite negative work-up.  Seen today in follow up for memory loss. Patient is currently on memantine 5 mg twice daily, tolerating well.  Prior MRI brain 02/2021 was reviewed, showing mild generalized cerebral and cerebellar atrophy, without acute intracranial abnormality.  She is stable from the cognitive standpoint.     Follow up in  6 months. Continue memantine 5 mg twice daily (unable to tolerate 10 mg twice daily) Recommend good control of cardiovascular risk factors.      Subjective:    This patient is accompanied in the office by  who supplements the history.  Previous records as well as any outside records available were reviewed prior to todays visit.    Any changes in memory since last visit? STM is worse" love to read but can't remember what I read 2 pages ago and have to go back, but LTM  it is ok" her husband reports that sometimes she may ask 3 times about appointments or dates.  In addition, she may have forgotten some common recipes.  Her husband says that surprisingly, she may remember the conversation that took place a long time ago.   Patient lives with: Husband repeats oneself?  Endorsed.  "She is at a steady point ".  Disoriented when walking into a room?  Patient denies   Leaving objects in unusual places?  Patient denies   Ambulates  with difficulty?   Patient denies  Walks 2-3 miles a day  Recent falls?  Patient denies   Any head injuries?  Patient denies   History of seizures?   Patient denies   Wandering behavior?  Patient denies   Patient drives?   Patient no longer drives "only the landmower " Any mood changes since last visit?  Patient denies   Any worsening depression?:  Patient denies   Hallucinations?  Patient denies    Paranoia?  Patient denies   Patient reports that sleeps well without vivid dreams, REM behavior or sleepwalking   History of sleep apnea?  Patient denies   Any hygiene concerns?  Patient denies   Independent of bathing and dressing?  Endorsed  Does the patient needs help with medications?  Denies Who is in charge of the finances?  Husband is in charge    Any changes in appetite?  Patient denies   Patient have trouble swallowing? Patient denies   Does the patient cook?  Patient denies  forgets common recipes such as a cake "and aggravates me to the point that I don't want to make them anymore " Any kitchen accidents such as leaving the stove on? Patient denies   Any headaches?  Patient denies   Double vision? Patient denies   Any focal numbness or tingling?  Patient denies   Chronic back pain Patient denies   Unilateral weakness?  Patient denies   Any tremors?  Patient denies   Any history of anosmia?  Patient denies   Any incontinence of urine?  Patient denies   Any bowel dysfunction?   Patient denies       Initial visit 02/15/21  The patient is seen in neurologic consultation at the request of Anne Housekeeper, MD for the evaluation of memory.  The patient is accompanied by  who supplements the history. This is a 81 y.o. year old RH  female who has had memory issues for about 1.5 years, worse over the last 2 months.  During the last Thanksgiving, her family noticed that she was having worsening of memory and speech disturbance.  Church members began to notice as well, when she was repeating the same stories, asking the same questions.  Her PCP placed her on memantine 5 mg daily, then increasing it to 10 mg daily which "for a while it helped especially with the slurring ", but over the last 2 weeks, he increase it to 10 mg twice daily without significant improvement.  Her speech continues to be slow, but family reports that this is better than prior.  She becomes very upset because she has always  been very organized, in charge of every document in the house, and now she is having problems and trying to stay focused and organized.  She denies being confused when entering the room, or leaving objects in unusual places.  She continues to drive but short distances, because over the last 6 months, if not using the GPS she will become lost if driving to unknown locations.  This has brought significant amount of frustration on her.  She becomes tearful at times, when she thinks that she could have Alzheimer's disease, as she took care of her mother with Alzheimer's when she was 20 years old.  She continues to read and do crossword puzzles and word findings.  She sleeps well, denies vivid dreams, sleepwalking, hallucinations or paranoia.  She is independent of bathing and dressing, there are no hygiene concerns.  She puts the medications on a pillbox (she could not think of the word "pillbox "), and checks a calendar frequently did not forget appointments.  Her husband has taken over the finances since last month.  Her appetite is good, denies trouble swallowing.  She does not cook very often.  She ambulates without a cane, but she had a couple of falls, one in June due to heat exhaustion resulting in presyncope, and another mechanical fall hitting the right side of her head in November 2022 without loss of consciousness.  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  She denies focal numbness.  She denies a history of stroke.  She denies using the computer on excessive basis.  Denies hormonal therapy, or long distance trips.  Denies chest pain or palpitations.  Denies shortness of breath.  Denies a history of tremors or anosmia.  No history of seizures.  Denies  urine frequency, but she does have mild incontinence.  Denies constipation or diarrhea.  Denies a history of sleep apnea, alcohol or tobacco.  States she has strong family history of Alzheimer's disease in mother, 2 sisters and maternal grandmother.   Recent labs 02/09/2021 shows abnormal lipid panel with TC 253, triglycerides 218, LDL 154 CMP calcium 10.8, creatinine 1.35. CBC was normal   CT scan of the head 12/22/2020 remarkable for no intracranial abnormality. Mild generalized cerebral and cerebellar atrophy.  Mild right ethmoid sinus mucosal thickening.   MRI brain 03/03/21 No acute or reversible finding. Generalized age related volume loss without lobar predominance. Mild chronic small-vessel ischemic changes of the pons and cerebral hemispheric white matter.   CT head 2/6/23No acute intracranial abnormality seen.  PREVIOUS MEDICATIONS: memantine 10 mg  (hallucinations)   CURRENT MEDICATIONS:  Outpatient Encounter Medications as of 09/19/2021  Medication Sig   Acetaminophen (TYLENOL ARTHRITIS EXT RELIEF PO) Take 1 tablet by mouth as needed (pain).   atenolol (TENORMIN) 25 MG tablet Take 25 mg by mouth daily.   Cyanocobalamin (VITAMIN B-12 PO) Take 1 capsule by mouth daily.   felodipine (PLENDIL) 5 MG 24 hr tablet Take 5 mg by mouth daily.   fluticasone (FLONASE) 50 MCG/ACT nasal spray Place 2 sprays into both nostrils daily.   hydrochlorothiazide (HYDRODIURIL) 25 MG tablet Take 25 mg by mouth daily. Take at 6 pm   ibuprofen (ADVIL) 200 MG tablet Take 200 mg by mouth every 6 (six) hours as needed for mild pain or headache.   loratadine (CLARITIN) 10 MG tablet Take 10 mg by mouth daily.   memantine (NAMENDA) 10 MG tablet Take 10 mg by mouth 2 (two) times daily.   Polyvinyl Alcohol-Povidone (REFRESH OP) Apply 2 drops to eye 3 (three) times daily as needed (dryness).   No facility-administered encounter medications on file as of 09/19/2021.        No data to display             02/16/2021    8:00 AM  Montreal Cognitive Assessment   Visuospatial/ Executive (0/5) 3  Naming (0/3) 1  Attention: Read list of digits (0/2) 2  Attention: Read list of letters (0/1) 1  Attention: Serial 7 subtraction starting at 100 (0/3) 0  Language: Repeat phrase (0/2) 2  Language : Fluency (0/1) 1  Abstraction (0/2) 0  Delayed Recall (0/5) 0  Orientation (0/6) 5  Total 15  Adjusted Score (based on education) 15    Objective:     PHYSICAL EXAMINATION:    VITALS:   Vitals:   09/19/21 1038  BP: (!) 130/57  Pulse: 66  Resp: 18  SpO2: 98%  Weight: 148 lb (67.1 kg)  Height: 5\' 3"  (1.6 m)    GEN:  The patient appears stated age and is in NAD. HEENT:  Normocephalic, atraumatic.   Neurological examination:  General: NAD, well-groomed, appears stated age. Orientation: The patient is alert. Oriented to person, place and date Cranial nerves: There is good facial symmetry.The speech is slow but fluent and clear. No aphasia or dysarthria. Fund of knowledge is appropriate. Recent and remote memory are impaired. Attention and concentration are reduced.  Able to name objects and repeat phrases.  Hearing is intact to conversational tone.    Sensation: Sensation is intact to light touch throughout Motor: Strength is at least antigravity x4. Tremors: none  DTR's 2/4 in UE/LE     Movement examination: Tone: There is normal tone in the UE/LE Abnormal movements:  no tremor.  No myoclonus.  No asterixis.   Coordination:  There is no decremation with RAM's. Normal finger to nose  Gait and Station: The patient has no difficulty arising out of a deep-seated chair without the use of the hands. The patient's stride length is good.  Gait is cautious and narrow.    Thank you for allowing the opportunity to participate in the care of this nice patient. Please do not hesitate to contact us for any questions or concerns.   Total time spent on today's visit was 31 minutes dedicated to this  patient today, preparing to see patient, examining the patient, ordering tests and/or medications and counseling the patient, documenting clinical information in the EHR or other health record, independently  interpreting results and communicating results to the patient/family, discussing treatment and goals, answering patient's questions and coordinating care.  Cc:  Anne Housekeeper, MD  Marlowe Kays 09/19/2021 12:11 PM

## 2021-10-05 ENCOUNTER — Ambulatory Visit
Admission: RE | Admit: 2021-10-05 | Discharge: 2021-10-05 | Disposition: A | Discharge status: Home / Self Care | Payer: Medicare PPO | Source: Ambulatory Visit | Attending: Internal Medicine | Admitting: Internal Medicine

## 2021-10-05 DIAGNOSIS — Z1231 Encounter for screening mammogram for malignant neoplasm of breast: Secondary | ICD-10-CM

## 2021-11-19 DIAGNOSIS — R059 Cough, unspecified: Secondary | ICD-10-CM | POA: Diagnosis not present

## 2021-11-19 DIAGNOSIS — R0981 Nasal congestion: Secondary | ICD-10-CM | POA: Diagnosis not present

## 2021-11-28 DIAGNOSIS — H524 Presbyopia: Secondary | ICD-10-CM | POA: Diagnosis not present

## 2021-11-28 DIAGNOSIS — H2513 Age-related nuclear cataract, bilateral: Secondary | ICD-10-CM | POA: Diagnosis not present

## 2021-11-28 DIAGNOSIS — H35363 Drusen (degenerative) of macula, bilateral: Secondary | ICD-10-CM | POA: Diagnosis not present

## 2021-12-17 DIAGNOSIS — Z23 Encounter for immunization: Secondary | ICD-10-CM | POA: Diagnosis not present

## 2021-12-31 DIAGNOSIS — C44311 Basal cell carcinoma of skin of nose: Secondary | ICD-10-CM | POA: Diagnosis not present

## 2021-12-31 DIAGNOSIS — D485 Neoplasm of uncertain behavior of skin: Secondary | ICD-10-CM | POA: Diagnosis not present

## 2022-02-25 DIAGNOSIS — C44311 Basal cell carcinoma of skin of nose: Secondary | ICD-10-CM | POA: Diagnosis not present

## 2022-02-25 DIAGNOSIS — L814 Other melanin hyperpigmentation: Secondary | ICD-10-CM | POA: Diagnosis not present

## 2022-02-25 DIAGNOSIS — L988 Other specified disorders of the skin and subcutaneous tissue: Secondary | ICD-10-CM | POA: Diagnosis not present

## 2022-02-25 DIAGNOSIS — L578 Other skin changes due to chronic exposure to nonionizing radiation: Secondary | ICD-10-CM | POA: Diagnosis not present

## 2022-03-05 DIAGNOSIS — Z23 Encounter for immunization: Secondary | ICD-10-CM | POA: Diagnosis not present

## 2022-03-05 DIAGNOSIS — N1831 Chronic kidney disease, stage 3a: Secondary | ICD-10-CM | POA: Diagnosis not present

## 2022-03-05 DIAGNOSIS — M810 Age-related osteoporosis without current pathological fracture: Secondary | ICD-10-CM | POA: Diagnosis not present

## 2022-03-05 DIAGNOSIS — I10 Essential (primary) hypertension: Secondary | ICD-10-CM | POA: Diagnosis not present

## 2022-03-05 DIAGNOSIS — Z0000 Encounter for general adult medical examination without abnormal findings: Secondary | ICD-10-CM | POA: Diagnosis not present

## 2022-03-05 DIAGNOSIS — Z1331 Encounter for screening for depression: Secondary | ICD-10-CM | POA: Diagnosis not present

## 2022-03-05 DIAGNOSIS — E7800 Pure hypercholesterolemia, unspecified: Secondary | ICD-10-CM | POA: Diagnosis not present

## 2022-03-05 DIAGNOSIS — R7303 Prediabetes: Secondary | ICD-10-CM | POA: Diagnosis not present

## 2022-03-05 DIAGNOSIS — G309 Alzheimer's disease, unspecified: Secondary | ICD-10-CM | POA: Diagnosis not present

## 2022-03-20 ENCOUNTER — Encounter: Payer: Self-pay | Admitting: Physician Assistant

## 2022-03-20 ENCOUNTER — Ambulatory Visit: Payer: Medicare PPO | Admitting: Physician Assistant

## 2022-03-20 VITALS — BP 159/79 | HR 65 | Resp 20 | Ht 63.0 in | Wt 153.0 lb

## 2022-03-20 DIAGNOSIS — F0280 Dementia in other diseases classified elsewhere without behavioral disturbance: Secondary | ICD-10-CM | POA: Diagnosis not present

## 2022-03-20 DIAGNOSIS — G309 Alzheimer's disease, unspecified: Secondary | ICD-10-CM | POA: Diagnosis not present

## 2022-03-20 NOTE — Patient Instructions (Signed)
It was a pleasure to see you today at our office.   Recommendations:  Continue Memantine 10 mg  2 times a day  Follow up 6  months   RECOMMENDATIONS FOR ALL PATIENTS WITH MEMORY PROBLEMS: 1. Continue to exercise (Recommend 30 minutes of walking everyday, or 3 hours every week) 2. Increase social interactions - continue going to [place] and enjoy social gatherings with friends and family 3. Eat healthy, avoid fried foods and eat more fruits and vegetables 4. Maintain adequate blood pressure, blood sugar, and blood cholesterol level. Reducing the risk of stroke and cardiovascular disease also helps promoting better memory. 5. Avoid stressful situations. Live a simple life and avoid aggravations. Organize your time and prepare for the next day in anticipation. 6. Sleep well, avoid any interruptions of sleep and avoid any distractions in the bedroom that may interfere with adequate sleep quality 7. Avoid sugar, avoid sweets as there is a strong link between excessive sugar intake, diabetes, and cognitive impairment We discussed the Mediterranean diet, which has been shown to help patients reduce the risk of progressive memory disorders and reduces cardiovascular risk. This includes eating fish, eat fruits and green leafy vegetables, nuts like almonds and hazelnuts, walnuts, and also use olive oil. Avoid fast foods and fried foods as much as possible. Avoid sweets and sugar as sugar use has been linked to worsening of memory function.  There is always a concern of gradual progression of memory problems. If this is the case, then we may need to adjust level of care according to patient needs. Support, both to the patient and caregiver, should then be put into place.     FALL PRECAUTIONS: Be cautious when walking. Scan the area for obstacles that may increase the risk of trips and falls. When getting up in the mornings, sit up at the edge of the bed for a few minutes before getting out of bed. Consider  elevating the bed at the head end to avoid drop of blood pressure when getting up. Walk always in a well-lit room (use night lights in the walls). Avoid area rugs or power cords from appliances in the middle of the walkways. Use a walker or a cane if necessary and consider physical therapy for balance exercise. Get your eyesight checked regularly.  FINANCIAL OVERSIGHT: Supervision, especially oversight when making financial decisions or transactions is also recommended.  HOME SAFETY: Consider the safety of the kitchen when operating appliances like stoves, microwave oven, and blender. Consider having supervision and share cooking responsibilities until no longer able to participate in those. Accidents with firearms and other hazards in the house should be identified and addressed as well.   ABILITY TO BE LEFT ALONE: If patient is unable to contact 911 operator, consider using LifeLine, or when the need is there, arrange for someone to stay with patients. Smoking is a fire hazard, consider supervision or cessation. Risk of wandering should be assessed by caregiver and if detected at any point, supervision and safe proof recommendations should be instituted.  MEDICATION SUPERVISION: Inability to self-administer medication needs to be constantly addressed. Implement a mechanism to ensure safe administration of the medications.   DRIVING: Regarding driving, in patients with progressive memory problems, driving will be impaired. We advise to have someone else do the driving if trouble finding directions or if minor accidents are reported. Independent driving assessment is available to determine safety of driving.   If you are interested in the driving assessment, you can contact the following:  [employer] in [place]  [facility] [place] [phone] or [phone]    [place] refers to food and lifestyle choices that are based on the traditions of countries located on the [employer]. This way of eating has been shown to help prevent certain conditions and improve outcomes for people who have chronic diseases, like kidney disease and heart disease. What are tips for following this plan? Lifestyle  Cook and eat meals together with your family, when possible. Drink enough fluid to keep your urine clear or pale yellow. Be physically active every day. This includes: Aerobic exercise like running or swimming. Leisure activities like gardening, walking, or housework. Get 7-8 hours of sleep each night. If recommended by your health care provider, drink red wine in moderation. This means 1 glass a day for nonpregnant women and 2 glasses a day for men. A glass of wine equals 5 oz (150 mL). Reading food labels  Check the serving size of packaged foods. For foods such as rice and pasta, the serving size refers to the amount of cooked product, not dry. Check the total fat in packaged foods. Avoid foods that have saturated fat or trans fats. Check the ingredients list for added sugars, such as corn syrup. Shopping  At the grocery store, buy most of your food from the areas near the walls of the store. This includes: Fresh fruits and vegetables (produce). Grains, beans, nuts, and seeds. Some of these may be available in unpackaged forms or large amounts (in bulk). Fresh seafood. Poultry and eggs. Low-fat dairy products. Buy whole ingredients instead of prepackaged foods. Buy fresh fruits and vegetables in-season from local farmers markets. Buy frozen fruits and vegetables in resealable bags. If you do not have access to quality fresh seafood, buy precooked frozen shrimp or canned fish, such as tuna, salmon, or sardines. Buy small amounts of raw or cooked vegetables, salads, or olives from the deli or salad bar at your store. Stock your pantry  so you always have certain foods on hand, such as olive oil, canned tuna, canned tomatoes, rice, pasta, and beans. Cooking  Cook foods with extra-virgin olive oil instead of using butter or other vegetable oils. Have meat as a side dish, and have vegetables or grains as your main dish. This means having meat in small portions or adding small amounts of meat to foods like pasta or stew. Use beans or vegetables instead of meat in common dishes like chili or lasagna. Experiment with different cooking methods. Try roasting or broiling vegetables instead of steaming or sauteing them. Add frozen vegetables to soups, stews, pasta, or rice. Add nuts or seeds for added healthy fat at each meal. You can add these to yogurt, salads, or vegetable dishes. Marinate fish or vegetables using olive oil, lemon juice, garlic, and fresh herbs. Meal planning  Plan to eat 1 vegetarian meal one day each week. Try to work up to 2 vegetarian meals, if possible. Eat seafood 2 or more times a week. Have healthy snacks readily available, such as: Vegetable sticks with hummus. Greek yogurt. Fruit and nut trail mix. Eat balanced meals throughout the week. This includes: Fruit: 2-3 servings a day Vegetables: 4-5 servings a day Low-fat dairy: 2 servings a day Fish, poultry, or lean meat: 1 serving a day Beans and legumes: 2 or more servings a week Nuts and seeds: 1-2 servings a day Whole grains: 6-8 servings  a day Extra-virgin olive oil: 3-4 servings a day Limit red meat and sweets to only a few servings a month What are my food choices? Mediterranean diet Recommended Grains: Whole-grain pasta. Brown rice. Bulgar wheat. Polenta. Couscous. Whole-wheat bread. [place]. Vegetables: Artichokes. Beets. Broccoli. Cabbage. Carrots. Eggplant. Green beans. Chard. Kale. Spinach. Onions. Leeks. Peas. Squash. Tomatoes. Peppers. Radishes. Fruits: Apples. Apricots. Avocado. Berries. Bananas. Cherries. Dates. Figs. Grapes.  Lemons. Melon. Oranges. Peaches. Plums. Pomegranate. Meats and other protein foods: Beans. Almonds. Sunflower seeds. Pine nuts. Peanuts. [place]. Salmon. Scallops. Shrimp. [place]. Tilapia. Clams. Oysters. Eggs. Dairy: Low-fat milk. Cheese. Greek yogurt. Beverages: Water. Red wine. Herbal tea. Fats and oils: Extra virgin olive oil. Avocado oil. Grape seed oil. Sweets and desserts: [place] yogurt with honey. Baked apples. Poached pears. Trail mix. Seasoning and other foods: Basil. Cilantro. Coriander. Cumin. Mint. Parsley. Sage. Rosemary. Tarragon. Garlic. Oregano. Thyme. Pepper. Balsalmic vinegar. Tahini. Hummus. Tomato sauce. Olives. Mushrooms. Limit these Grains: Prepackaged pasta or rice dishes. Prepackaged cereal with added sugar. Vegetables: Deep fried potatoes (french fries). Fruits: Fruit canned in syrup. Meats and other protein foods: Beef. Pork. Lamb. Poultry with skin. Hot dogs. [place] Salines. Dairy: Ice cream. Sour cream. Whole milk. Beverages: Juice. Sugar-sweetened soft drinks. Beer. Liquor and spirits. Fats and oils: Butter. Canola oil. Vegetable oil. Beef fat (tallow). Lard. Sweets and desserts: Cookies. Cakes. Pies. Candy. Seasoning and other foods: Mayonnaise. Premade sauces and marinades. The items listed may not be a complete list. Talk with your dietitian about what dietary choices are right for you. Summary The Mediterranean diet includes both food and lifestyle choices. Eat a variety of fresh fruits and vegetables, beans, nuts, seeds, and whole grains. Limit the amount of red meat and sweets that you eat. Talk with your health care provider about whether it is safe for you to drink red wine in moderation. This means 1 glass a day for nonpregnant women and 2 glasses a day for men. A glass of wine equals 5 oz (150 mL). This information is not intended to replace advice given to you by your health care provider. Make sure you discuss any questions you have with your health care  provider. Document Released: 08/24/2015 Document Revised: 09/26/2015 Document Reviewed: 08/24/2015 Elsevier Interactive Patient Education  2017 [employer].

## 2022-03-20 NOTE — Progress Notes (Signed)
Assessment/Plan:   Mild Dementia, likely due to Alzheimer's Disease   Anne Ford is a very pleasant 82 y.o. RH female with a history of hypertension, hyperlipidemia, vitamin D deficiency, prediabetes, anxiety, depression, paresthesias with negative work-up seen today in follow up for memory loss. Patient is currently on memantine 10 mg, tolerating well.  MRI brain personally reviewed ( 02/2021) remarkable for  mild generalized cerebral and cerebellar atrophy, without acute intracranial abnormality. Her memory is stable from prior visit.  MMSE is 22/30       Follow up in  6 months. Continue Memantine 10 mg bid  Side effects were discussed  Continue to control mood as per PCP Recommend good control of cardiovascular risk factors.       Subjective:    This patient is accompanied in the office by her husband and daughter who supplement  the history.  Previous records as well as any outside records available were reviewed prior to todays visit. Patient was last seen on 09/2021, last MoCA 02/2021 was 15/30      Any changes in memory since last visit?  "I'm good" , Husband reports memory has improved since memantine increased by PCP. "LTM is good". Likes to read the Bible about 4 hours a day. Likes to go to [employer]. . repeats oneself?  Endorsed, she may ask several times about appointments or dates. Uses a calendar and refers to it.  Disoriented when walking into a room?  Patient denies   Leaving objects in unusual places?  denies .   Wandering behavior?  denies   Any personality changes since last visit?  denies . Better mood than prior.  Any worsening depression?:  denies   Hallucinations or paranoia?  denies   Seizures?    denies    Any sleep changes?  Sleeps well about >8 h . Denies vivid dreams, REM behavior or sleepwalking   Sleep apnea?   denies   Any hygiene concerns?    denies   Independent of bathing and dressing?  Endorsed  Does the patient needs help with medications?   Patient is in charge   Who is in charge of the finances?  Husband is in charge     Any changes in appetite?  denies    Patient have trouble swallowing?  denies   Does the patient cook?  Endorsed,  cakes and devil eggs without any issues Any kitchen accidents such as leaving the stove on? Patient denies   Any headaches?   denies   Chronic back pain  denies   Ambulates with difficulty?     denies   Recent falls or head injuries? denies     Unilateral weakness, numbness or tingling?    denies   Any tremors?  denies   Any anosmia?  Patient denies   Any incontinence of urine?  denies   Any bowel dysfunction?     denies      Patient lives  with her husband Does the patient drive?She no longer drives  Initial visit 02/15/21  The patient is seen in neurologic consultation at the request of Wenda Low, MD for the evaluation of memory.  The patient is accompanied by  who supplements the history. This is a 82 y.o. year old RH  female who has had memory issues for about 1.5 years, worse over the last 2 months.  During the last Thanksgiving, her family noticed that she was having worsening of memory and speech disturbance.  Church members began  to notice as well, when she was repeating the same stories, asking the same questions.  Her PCP placed her on memantine 5 mg daily, then increasing it to 10 mg daily which "for a while it helped especially with the slurring ", but over the last 2 weeks, he increase it to 10 mg twice daily without significant improvement.  Her speech continues to be slow, but family reports that this is better than prior.  She becomes very upset because she has always been very organized, in charge of every document in the house, and now she is having problems and trying to stay focused and organized.  She denies being confused when entering the room, or leaving objects in unusual places.  She continues to drive but short distances, because over the last 6 months, if not using the GPS  she will become lost if driving to unknown locations.  This has brought significant amount of frustration on her.  She becomes tearful at times, when she thinks that she could have Alzheimer's disease, as she took care of her mother with Alzheimer's when she was 53 years old.  She continues to read and do crossword puzzles and word findings.  She sleeps well, denies vivid dreams, sleepwalking, hallucinations or paranoia.  She is independent of bathing and dressing, there are no hygiene concerns.  She puts the medications on a pillbox (she could not think of the word "pillbox "), and checks a calendar frequently did not forget appointments.  Her husband has taken over the finances since last month.  Her appetite is good, denies trouble swallowing.  She does not cook very often.  She ambulates without a cane, but she had a couple of falls, one in June due to heat exhaustion resulting in presyncope, and another mechanical fall hitting the right side of her head in November 2022 without loss of consciousness.  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  She denies focal numbness.  She denies a history of stroke.  She denies using the computer on excessive basis.  Denies hormonal therapy, or long distance trips.  Denies chest pain or palpitations.  Denies shortness of breath.  Denies a history of tremors or anosmia.  No history of seizures.  Denies urine frequency, but she does have mild incontinence.  Denies constipation or diarrhea.  Denies a history of sleep apnea, alcohol or tobacco.  States she has strong family history of Alzheimer's disease in mother, 2 sisters and maternal grandmother.   Recent labs 02/09/2021 shows abnormal lipid panel with TC 253,  triglycerides 218, LDL 154 CMP calcium 10.8, creatinine 1.35. CBC was normal   CT scan of the head 12/22/2020 remarkable for no intracranial abnormality. Mild generalized cerebral and cerebellar atrophy.  Mild right ethmoid sinus mucosal thickening.   MRI brain 03/03/21 No acute or reversible finding. Generalized age related volume loss without lobar predominance. Mild chronic small-vessel ischemic changes of the pons and cerebral hemispheric white matter.   CT head 2/6/23No acute intracranial abnormality seen.    PREVIOUS MEDICATIONS:   CURRENT MEDICATIONS:  Outpatient Encounter Medications as of 03/20/2022  Medication Sig   Acetaminophen (TYLENOL ARTHRITIS EXT RELIEF PO) Take 1 tablet by mouth as needed (pain).   atenolol (TENORMIN)  25 MG tablet Take 25 mg by mouth daily.   Cyanocobalamin (VITAMIN B-12 PO) Take 1 capsule by mouth daily.   felodipine (PLENDIL) 5 MG 24 hr tablet Take 5 mg by mouth daily.   fluticasone (FLONASE) 50 MCG/ACT nasal spray Place 2 sprays into both nostrils daily.   hydrochlorothiazide (HYDRODIURIL) 25 MG tablet Take 25 mg by mouth daily. Take at 6 pm   ibuprofen (ADVIL) 200 MG tablet Take 200 mg by mouth every 6 (six) hours as needed for mild pain or headache.   loratadine (CLARITIN) 10 MG tablet Take 10 mg by mouth daily.   memantine (NAMENDA) 10 MG tablet Take 10 mg by mouth 2 (two) times daily.   Polyvinyl Alcohol-Povidone (REFRESH OP) Apply 2 drops to eye 3 (three) times daily as needed (dryness).   No facility-administered encounter medications on file as of 03/20/2022.       03/20/2022   11:00 AM  MMSE - Mini Mental State Exam  Orientation to time 3  Orientation to Place 3  Registration 3  Attention/ Calculation 5  Recall 0  Language- name 2 objects 2  Language- repeat 1  Language- follow 3 step command 3  Language- read & follow direction 1  Write a sentence 1  Copy design 0  Total score 22      02/16/2021    8:00 AM  Montreal Cognitive  Assessment   Visuospatial/ Executive (0/5) 3  Naming (0/3) 1  Attention: Read list of digits (0/2) 2  Attention: Read list of letters (0/1) 1  Attention: Serial 7 subtraction starting at 100 (0/3) 0  Language: Repeat phrase (0/2) 2  Language : Fluency (0/1) 1  Abstraction (0/2) 0  Delayed Recall (0/5) 0  Orientation (0/6) 5  Total 15  Adjusted Score (based on education) 15    Objective:     PHYSICAL EXAMINATION:    VITALS:   Vitals:   03/20/22 1046  BP: (!) 159/79  Pulse: 65  Resp: 20  SpO2: 97%  Weight: 153 lb (69.4 kg)  Height: '5\' 3"'$  (1.6 m)    GEN:  The patient appears stated age and is in NAD. HEENT:  Normocephalic, atraumatic.   Neurological examination:  General: NAD, well-groomed, appears stated age. Orientation: The patient is alert. Oriented to person, place and date Cranial nerves: There is good facial symmetry.The speech is not fluent, slow, but clear. No aphasia or dysarthria. Fund of knowledge is appropriate. Recent and remote memory are impaired. Attention and concentration are reduced.  Able to name objects and repeat phrases.  Hearing is intact to conversational tone.  Sensation: Sensation is intact to light touch throughout Motor: Strength is at least antigravity x4. DTR's 2/4 in UE/LE     Movement examination: Tone: There is normal tone in the UE/LE Abnormal movements:  no tremor.  No myoclonus.  No asterixis.   Coordination:  There is no decremation with RAM's. Normal finger to nose  Gait and Station: The patient has no difficulty arising out of a deep-seated chair without the use of the hands. The patient's stride length is good.  Gait is cautious and narrow.    Thank you for allowing [place] the opportunity to participate in the care of this nice patient. Please do not hesitate to contact us for any questions or concerns.   Total time spent on today's visit was 21 minutes dedicated to this patient today, preparing to see patient, examining the  patient, ordering tests and/or medications and counseling the  patient, documenting clinical information in the EHR or other health record, independently interpreting results and communicating results to the patient/family, discussing treatment and goals, answering patient's questions and coordinating care.  Cc:  Wenda Low, MD  Sharene Butters 03/20/2022 11:25 AM

## 2022-07-23 DIAGNOSIS — D0439 Carcinoma in situ of skin of other parts of face: Secondary | ICD-10-CM | POA: Diagnosis not present

## 2022-07-23 DIAGNOSIS — D485 Neoplasm of uncertain behavior of skin: Secondary | ICD-10-CM | POA: Diagnosis not present

## 2022-07-23 DIAGNOSIS — D225 Melanocytic nevi of trunk: Secondary | ICD-10-CM | POA: Diagnosis not present

## 2022-07-23 DIAGNOSIS — D2261 Melanocytic nevi of right upper limb, including shoulder: Secondary | ICD-10-CM | POA: Diagnosis not present

## 2022-07-23 DIAGNOSIS — D2262 Melanocytic nevi of left upper limb, including shoulder: Secondary | ICD-10-CM | POA: Diagnosis not present

## 2022-07-23 DIAGNOSIS — L570 Actinic keratosis: Secondary | ICD-10-CM | POA: Diagnosis not present

## 2022-07-23 DIAGNOSIS — Z85828 Personal history of other malignant neoplasm of skin: Secondary | ICD-10-CM | POA: Diagnosis not present

## 2022-07-23 DIAGNOSIS — D2272 Melanocytic nevi of left lower limb, including hip: Secondary | ICD-10-CM | POA: Diagnosis not present

## 2022-08-22 DIAGNOSIS — D0439 Carcinoma in situ of skin of other parts of face: Secondary | ICD-10-CM | POA: Diagnosis not present

## 2022-09-04 ENCOUNTER — Other Ambulatory Visit: Payer: Self-pay | Admitting: Internal Medicine

## 2022-09-04 DIAGNOSIS — Z1231 Encounter for screening mammogram for malignant neoplasm of breast: Secondary | ICD-10-CM

## 2022-09-19 DIAGNOSIS — E782 Mixed hyperlipidemia: Secondary | ICD-10-CM | POA: Diagnosis not present

## 2022-09-19 DIAGNOSIS — I10 Essential (primary) hypertension: Secondary | ICD-10-CM | POA: Diagnosis not present

## 2022-09-19 DIAGNOSIS — R7303 Prediabetes: Secondary | ICD-10-CM | POA: Diagnosis not present

## 2022-09-19 DIAGNOSIS — J309 Allergic rhinitis, unspecified: Secondary | ICD-10-CM | POA: Diagnosis not present

## 2022-09-19 DIAGNOSIS — N1832 Chronic kidney disease, stage 3b: Secondary | ICD-10-CM | POA: Diagnosis not present

## 2022-09-19 DIAGNOSIS — F0280 Dementia in other diseases classified elsewhere without behavioral disturbance: Secondary | ICD-10-CM | POA: Diagnosis not present

## 2022-09-19 DIAGNOSIS — G309 Alzheimer's disease, unspecified: Secondary | ICD-10-CM | POA: Diagnosis not present

## 2022-09-23 ENCOUNTER — Encounter: Payer: Self-pay | Admitting: Physician Assistant

## 2022-09-23 ENCOUNTER — Ambulatory Visit: Payer: Medicare PPO | Admitting: Physician Assistant

## 2022-09-23 VITALS — BP 141/60 | HR 70 | Ht 63.0 in | Wt 138.0 lb

## 2022-09-23 DIAGNOSIS — G309 Alzheimer's disease, unspecified: Secondary | ICD-10-CM | POA: Diagnosis not present

## 2022-09-23 DIAGNOSIS — F0280 Dementia in other diseases classified elsewhere without behavioral disturbance: Secondary | ICD-10-CM | POA: Diagnosis not present

## 2022-09-23 NOTE — Patient Instructions (Signed)
It was a pleasure to see you today at our office.   Recommendations:  Continue Memantine 10 mg  2 times a day  Follow up 6  months   RECOMMENDATIONS FOR ALL PATIENTS WITH MEMORY PROBLEMS: 1. Continue to exercise (Recommend 30 minutes of walking everyday, or 3 hours every week) 2. Increase social interactions - continue going to [place] and enjoy social gatherings with friends and family 3. Eat healthy, avoid fried foods and eat more fruits and vegetables 4. Maintain adequate blood pressure, blood sugar, and blood cholesterol level. Reducing the risk of stroke and cardiovascular disease also helps promoting better memory. 5. Avoid stressful situations. Live a simple life and avoid aggravations. Organize your time and prepare for the next day in anticipation. 6. Sleep well, avoid any interruptions of sleep and avoid any distractions in the bedroom that may interfere with adequate sleep quality 7. Avoid sugar, avoid sweets as there is a strong link between excessive sugar intake, diabetes, and cognitive impairment We discussed the Mediterranean diet, which has been shown to help patients reduce the risk of progressive memory disorders and reduces cardiovascular risk. This includes eating fish, eat fruits and green leafy vegetables, nuts like almonds and hazelnuts, walnuts, and also use olive oil. Avoid fast foods and fried foods as much as possible. Avoid sweets and sugar as sugar use has been linked to worsening of memory function.  There is always a concern of gradual progression of memory problems. If this is the case, then we may need to adjust level of care according to patient needs. Support, both to the patient and caregiver, should then be put into place.     FALL PRECAUTIONS: Be cautious when walking. Scan the area for obstacles that may increase the risk of trips and falls. When getting up in the mornings, sit up at the edge of the bed for a few minutes before getting out of bed. Consider  elevating the bed at the head end to avoid drop of blood pressure when getting up. Walk always in a well-lit room (use night lights in the walls). Avoid area rugs or power cords from appliances in the middle of the walkways. Use a walker or a cane if necessary and consider physical therapy for balance exercise. Get your eyesight checked regularly.  FINANCIAL OVERSIGHT: Supervision, especially oversight when making financial decisions or transactions is also recommended.  HOME SAFETY: Consider the safety of the kitchen when operating appliances like stoves, microwave oven, and blender. Consider having supervision and share cooking responsibilities until no longer able to participate in those. Accidents with firearms and other hazards in the house should be identified and addressed as well.   ABILITY TO BE LEFT ALONE: If patient is unable to contact 911 operator, consider using LifeLine, or when the need is there, arrange for someone to stay with patients. Smoking is a fire hazard, consider supervision or cessation. Risk of wandering should be assessed by caregiver and if detected at any point, supervision and safe proof recommendations should be instituted.  MEDICATION SUPERVISION: Inability to self-administer medication needs to be constantly addressed. Implement a mechanism to ensure safe administration of the medications.   DRIVING: Regarding driving, in patients with progressive memory problems, driving will be impaired. We advise to have someone else do the driving if trouble finding directions or if minor accidents are reported. Independent driving assessment is available to determine safety of driving.   If you are interested in the driving assessment, you can contact the following:  [employer] in [place]  [facility] [place] [phone] or [phone]    [place] refers to food and lifestyle choices that are based on the traditions of countries located on the [employer]. This way of eating has been shown to help prevent certain conditions and improve outcomes for people who have chronic diseases, like kidney disease and heart disease. What are tips for following this plan? Lifestyle  Cook and eat meals together with your family, when possible. Drink enough fluid to keep your urine clear or pale yellow. Be physically active every day. This includes: Aerobic exercise like running or swimming. Leisure activities like gardening, walking, or housework. Get 7-8 hours of sleep each night. If recommended by your health care provider, drink red wine in moderation. This means 1 glass a day for nonpregnant women and 2 glasses a day for men. A glass of wine equals 5 oz (150 mL). Reading food labels  Check the serving size of packaged foods. For foods such as rice and pasta, the serving size refers to the amount of cooked product, not dry. Check the total fat in packaged foods. Avoid foods that have saturated fat or trans fats. Check the ingredients list for added sugars, such as corn syrup. Shopping  At the grocery store, buy most of your food from the areas near the walls of the store. This includes: Fresh fruits and vegetables (produce). Grains, beans, nuts, and seeds. Some of these may be available in unpackaged forms or large amounts (in bulk). Fresh seafood. Poultry and eggs. Low-fat dairy products. Buy whole ingredients instead of prepackaged foods. Buy fresh fruits and vegetables in-season from local farmers markets. Buy frozen fruits and vegetables in resealable bags. If you do not have access to quality fresh seafood, buy precooked frozen shrimp or canned fish, such as tuna, salmon, or sardines. Buy small amounts of raw or cooked vegetables, salads, or olives from the deli or salad bar at your store. Stock your pantry  so you always have certain foods on hand, such as olive oil, canned tuna, canned tomatoes, rice, pasta, and beans. Cooking  Cook foods with extra-virgin olive oil instead of using butter or other vegetable oils. Have meat as a side dish, and have vegetables or grains as your main dish. This means having meat in small portions or adding small amounts of meat to foods like pasta or stew. Use beans or vegetables instead of meat in common dishes like chili or lasagna. Experiment with different cooking methods. Try roasting or broiling vegetables instead of steaming or sauteing them. Add frozen vegetables to soups, stews, pasta, or rice. Add nuts or seeds for added healthy fat at each meal. You can add these to yogurt, salads, or vegetable dishes. Marinate fish or vegetables using olive oil, lemon juice, garlic, and fresh herbs. Meal planning  Plan to eat 1 vegetarian meal one day each week. Try to work up to 2 vegetarian meals, if possible. Eat seafood 2 or more times a week. Have healthy snacks readily available, such as: Vegetable sticks with hummus. Greek yogurt. Fruit and nut trail mix. Eat balanced meals throughout the week. This includes: Fruit: 2-3 servings a day Vegetables: 4-5 servings a day Low-fat dairy: 2 servings a day Fish, poultry, or lean meat: 1 serving a day Beans and legumes: 2 or more servings a week Nuts and seeds: 1-2 servings a day Whole grains: 6-8 servings  a day Extra-virgin olive oil: 3-4 servings a day Limit red meat and sweets to only a few servings a month What are my food choices? Mediterranean diet Recommended Grains: Whole-grain pasta. Brown rice. Bulgar wheat. Polenta. Couscous. Whole-wheat bread. [place]. Vegetables: Artichokes. Beets. Broccoli. Cabbage. Carrots. Eggplant. Green beans. Chard. Kale. Spinach. Onions. Leeks. Peas. Squash. Tomatoes. Peppers. Radishes. Fruits: Apples. Apricots. Avocado. Berries. Bananas. Cherries. Dates. Figs. Grapes.  Lemons. Melon. Oranges. Peaches. Plums. Pomegranate. Meats and other protein foods: Beans. Almonds. Sunflower seeds. Pine nuts. Peanuts. [place]. Salmon. Scallops. Shrimp. [place]. Tilapia. Clams. Oysters. Eggs. Dairy: Low-fat milk. Cheese. Greek yogurt. Beverages: Water. Red wine. Herbal tea. Fats and oils: Extra virgin olive oil. Avocado oil. Grape seed oil. Sweets and desserts: [place] yogurt with honey. Baked apples. Poached pears. Trail mix. Seasoning and other foods: Basil. Cilantro. Coriander. Cumin. Mint. Parsley. Sage. Rosemary. Tarragon. Garlic. Oregano. Thyme. Pepper. Balsalmic vinegar. Tahini. Hummus. Tomato sauce. Olives. Mushrooms. Limit these Grains: Prepackaged pasta or rice dishes. Prepackaged cereal with added sugar. Vegetables: Deep fried potatoes (french fries). Fruits: Fruit canned in syrup. Meats and other protein foods: Beef. Pork. Lamb. Poultry with skin. Hot dogs. [place] Salines. Dairy: Ice cream. Sour cream. Whole milk. Beverages: Juice. Sugar-sweetened soft drinks. Beer. Liquor and spirits. Fats and oils: Butter. Canola oil. Vegetable oil. Beef fat (tallow). Lard. Sweets and desserts: Cookies. Cakes. Pies. Candy. Seasoning and other foods: Mayonnaise. Premade sauces and marinades. The items listed may not be a complete list. Talk with your dietitian about what dietary choices are right for you. Summary The Mediterranean diet includes both food and lifestyle choices. Eat a variety of fresh fruits and vegetables, beans, nuts, seeds, and whole grains. Limit the amount of red meat and sweets that you eat. Talk with your health care provider about whether it is safe for you to drink red wine in moderation. This means 1 glass a day for nonpregnant women and 2 glasses a day for men. A glass of wine equals 5 oz (150 mL). This information is not intended to replace advice given to you by your health care provider. Make sure you discuss any questions you have with your health care  provider. Document Released: 08/24/2015 Document Revised: 09/26/2015 Document Reviewed: 08/24/2015 Elsevier Interactive Patient Education  2017 [employer].

## 2022-09-23 NOTE — Progress Notes (Signed)
Assessment/Plan:   Dementia likely due to Alzheimer's disease   Anne Ford is a very pleasant 82 y.o. RH female with a history of hypertension, hyperlipidemia, vitamin D deficiency, prediabetes, anxiety, depression, paresthesias with negative workup for neuropathy, and a history of dementia likely due to Alzheimer's disease seen today in follow up for memory loss. Patient is currently on memantine 10 mg twice daily. MMSE is 22/30, memory is stable. Patient is able to participate on  IADLs. She no longer drives.      Follow up in 6  months. Continue memantine 10 mg twice a day Continue B12 supplements. Recommend good control of her cardiovascular risk factors Continue to control mood as per PCP     Subjective:    This patient is accompanied in the office by her husband who supplements the history.  Previous records as well as any outside records available were reviewed prior to todays visit. Patient was last seen on 03/20/2022 with MMSE of 22/30    Any changes in memory since last visit? "It is pretty good".  She enjoys reading the Bible about 4 hours a day and likes to go to [place]. She likes to [facility] the yard and walk frequently.  repeats oneself?  Endorsed, especially with appointments and dates.  Uses a calendar for assistance. Disoriented when walking into a room?  Patient denies    Leaving objects in unusual places?  May misplace things such as groceries where the staples are, but not often.   Wandering behavior?  Denies. She always walks with her husband. "Anne Ford been married 63 years so we always go together" Any personality changes since last visit?  Denies. Sometimes she feels sorry for herself because of her memory issues and her husband has to encourage her.    Any worsening depression?:  Denies.   Hallucinations or paranoia?  Denies.   Seizures? denies    Any sleep changes?  Sleeps well.  Denies vivid dreams, REM behavior or sleepwalking   Sleep apnea?   Denies.    Any hygiene concerns? Denies.  Independent of bathing and dressing?  Endorsed  Does the patient needs help with medications?  Patient is in charge   Who is in charge of the finances?  Husband is in charge     Any changes in appetite?  Denies.     Patient have trouble swallowing? Denies.   Does the patient cook?  Yes, especially devil cakes and eggs, denies forgetting  common recipes. Any headaches?   Denies.   Chronic back pain  denies. Ambulates with difficulty? Denies. Only one time, 2 weeks ago she fell from the bed without any injury, head injury, or  LOC   Unilateral weakness, numbness or tingling? denies   Any tremors?  Denies   Any anosmia?  Denies   Any incontinence of urine? Endorsed, uses pads Any bowel dysfunction?   Denies      Patient lives with husband Does the patient drive? No longer drives     Initial visit 02/15/21  The patient is seen in neurologic consultation at the request of Anne Housekeeper, MD for the evaluation of memory.  The patient is accompanied by  who supplements the history. This is a 82 y.o. year old RH  female who has had memory issues for about 1.5 years, worse over the last 2 months.  During the last Thanksgiving, her family noticed that she was having worsening of memory and speech disturbance.  Church members began to notice  as well, when she was repeating the same stories, asking the same questions.  Her PCP placed her on memantine 5 mg daily, then increasing it to 10 mg daily which "for a while it helped especially with the slurring ", but over the last 2 weeks, he increase it to 10 mg twice daily without significant improvement.  Her speech continues to be slow, but family reports that this is better than prior.  She becomes very upset because she has always been very organized, in charge of every document in the house, and now she is having problems and trying to stay focused and organized.  She denies being confused when entering the room, or leaving  objects in unusual places.  She continues to drive but short distances, because over the last 6 months, if not using the GPS she will become lost if driving to unknown locations.  This has brought significant amount of frustration on her.  She becomes tearful at times, when she thinks that she could have Alzheimer's disease, as she took care of her mother with Alzheimer's when she was 26 years old.  She continues to read and do crossword puzzles and word findings.  She sleeps well, denies vivid dreams, sleepwalking, hallucinations or paranoia.  She is independent of bathing and dressing, there are no hygiene concerns.  She puts the medications on a pillbox (she could not think of the word "pillbox "), and checks a calendar frequently did not forget appointments.  Her husband has taken over the finances since last month.  Her appetite is good, denies trouble swallowing.  She does not cook very often.  She ambulates without a cane, but she had a couple of falls, one in June due to heat exhaustion resulting in presyncope, and another mechanical fall hitting the right side of her head in November 2022 without loss of consciousness.  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  She denies focal numbness.  She denies a history of stroke.  She denies using the computer on excessive basis.  Denies hormonal therapy, or long distance trips.  Denies chest pain or palpitations.  Denies shortness of breath.  Denies a history of tremors or anosmia.  No history of seizures.  Denies urine frequency, but she does have mild incontinence.  Denies constipation or diarrhea.  Denies a history of sleep apnea, alcohol or tobacco.  States she has strong family history of Alzheimer's  disease in mother, 2 sisters and maternal grandmother.   labs 02/09/2021 shows abnormal lipid panel with TC 253, triglycerides 218, LDL 154 CMP calcium 10.8, creatinine 1.35. CBC was normal   CT scan of the head 12/22/2020 remarkable for no intracranial abnormality. Mild generalized cerebral and cerebellar atrophy.  Mild right ethmoid sinus mucosal thickening.   MRI brain 03/03/21 No acute or reversible finding. Generalized age related volume loss without lobar predominance. Mild chronic small-vessel ischemic changes of the pons and cerebral hemispheric white matter.   CT head 2/6/23No acute intracranial abnormality seen.  PREVIOUS MEDICATIONS:   CURRENT MEDICATIONS:  Outpatient Encounter Medications as of 09/23/2022  Medication Sig   Acetaminophen (TYLENOL ARTHRITIS EXT RELIEF PO) Take 1 tablet by mouth as needed (pain).   atenolol (TENORMIN) 25 MG tablet Take 25  mg by mouth daily.   Cyanocobalamin (VITAMIN B-12 PO) Take 1 capsule by mouth daily.   felodipine (PLENDIL) 5 MG 24 hr tablet Take 5 mg by mouth daily.   fluticasone (FLONASE) 50 MCG/ACT nasal spray Place 2 sprays into both nostrils daily.   hydrochlorothiazide (HYDRODIURIL) 25 MG tablet Take 25 mg by mouth daily. Take at 6 pm   ibuprofen (ADVIL) 200 MG tablet Take 200 mg by mouth every 6 (six) hours as needed for mild pain or headache.   loratadine (CLARITIN) 10 MG tablet Take 10 mg by mouth daily.   memantine (NAMENDA) 10 MG tablet Take 10 mg by mouth 2 (two) times daily.   Polyvinyl Alcohol-Povidone (REFRESH OP) Apply 2 drops to eye 3 (three) times daily as needed (dryness).   No facility-administered encounter medications on file as of 09/23/2022.       09/23/2022   11:00 AM 03/20/2022   11:00 AM  MMSE - Mini Mental State Exam  Orientation to time 1 3  Orientation to Place 5 3  Registration 3 3  Attention/ Calculation 4 5  Recall 1 0  Language- name 2 objects 2 2  Language- repeat 1 1  Language- follow 3 step command 3 3   Language- read & follow direction 1 1  Write a sentence 1 1  Copy design 0 0  Total score 22 22      02/16/2021    8:00 AM  Montreal Cognitive Assessment   Visuospatial/ Executive (0/5) 3  Naming (0/3) 1  Attention: Read list of digits (0/2) 2  Attention: Read list of letters (0/1) 1  Attention: Serial 7 subtraction starting at 100 (0/3) 0  Language: Repeat phrase (0/2) 2  Language : Fluency (0/1) 1  Abstraction (0/2) 0  Delayed Recall (0/5) 0  Orientation (0/6) 5  Total 15  Adjusted Score (based on education) 15    Objective:     PHYSICAL EXAMINATION:    VITALS:   Vitals:   09/23/22 1046  BP: (!) 141/60  Pulse: 70  SpO2: 98%  Weight: 138 lb (62.6 kg)  Height: 5\' 3"  (1.6 m)    GEN:  The patient appears stated age and is in NAD. HEENT:  Normocephalic, atraumatic.   Neurological examination:  General: NAD, well-groomed, appears stated age. Orientation: The patient is alert. Oriented to person, place and  not  to date Cranial nerves: There is good facial symmetry.The speech is none fluent but clear. No aphasia or dysarthria. Fund of knowledge is appropriate. Recent and remote memory are impaired. Attention and concentration are reduced.  Able to name objects and repeat phrases.  Hearing is intact to conversational tone.   Sensation: Sensation is intact to light touch throughout Motor: Strength is at least antigravity x4. DTR's 2/4 in UE/LE     Movement examination: Tone: There is normal tone in the UE/LE Abnormal movements:  no tremor.  No myoclonus.  No asterixis.   Coordination:  There is no decremation with RAM's. Normal finger to nose  Gait and Station: The patient has no difficulty arising out of a deep-seated chair without the use of the hands. The patient's stride length is good.  Gait is cautious and narrow.    Thank you for allowing [place] the opportunity to participate in the care of this nice patient. Please do not hesitate to contact us for any questions  or concerns.   Total time spent on today's visit was 37 minutes dedicated to this patient today, preparing  to see patient, examining the patient, ordering tests and/or medications and counseling the patient, documenting clinical information in the EHR or other health record, independently interpreting results and communicating results to the patient/family, discussing treatment and goals, answering patient's questions and coordinating care.  Cc:  Anne Housekeeper, MD  Marlowe Kays 09/23/2022 11:36 AM

## 2022-10-08 ENCOUNTER — Ambulatory Visit
Admission: RE | Admit: 2022-10-08 | Discharge: 2022-10-08 | Disposition: A | Discharge status: Home / Self Care | Payer: Medicare PPO | Source: Ambulatory Visit | Attending: Internal Medicine | Admitting: Internal Medicine

## 2022-10-08 DIAGNOSIS — Z1231 Encounter for screening mammogram for malignant neoplasm of breast: Secondary | ICD-10-CM

## 2022-11-05 DIAGNOSIS — Z23 Encounter for immunization: Secondary | ICD-10-CM | POA: Diagnosis not present

## 2022-12-04 DIAGNOSIS — H524 Presbyopia: Secondary | ICD-10-CM | POA: Diagnosis not present

## 2022-12-04 DIAGNOSIS — H35363 Drusen (degenerative) of macula, bilateral: Secondary | ICD-10-CM | POA: Diagnosis not present

## 2023-01-13 ENCOUNTER — Encounter: Payer: Self-pay | Admitting: Physician Assistant

## 2023-02-21 DIAGNOSIS — R053 Chronic cough: Secondary | ICD-10-CM | POA: Diagnosis not present

## 2023-02-21 DIAGNOSIS — B349 Viral infection, unspecified: Secondary | ICD-10-CM | POA: Diagnosis not present

## 2023-02-28 DIAGNOSIS — J40 Bronchitis, not specified as acute or chronic: Secondary | ICD-10-CM | POA: Diagnosis not present

## 2023-03-04 DIAGNOSIS — Z08 Encounter for follow-up examination after completed treatment for malignant neoplasm: Secondary | ICD-10-CM | POA: Diagnosis not present

## 2023-03-04 DIAGNOSIS — Z85828 Personal history of other malignant neoplasm of skin: Secondary | ICD-10-CM | POA: Diagnosis not present

## 2023-03-04 DIAGNOSIS — D2262 Melanocytic nevi of left upper limb, including shoulder: Secondary | ICD-10-CM | POA: Diagnosis not present

## 2023-03-04 DIAGNOSIS — D225 Melanocytic nevi of trunk: Secondary | ICD-10-CM | POA: Diagnosis not present

## 2023-03-04 DIAGNOSIS — L821 Other seborrheic keratosis: Secondary | ICD-10-CM | POA: Diagnosis not present

## 2023-03-04 DIAGNOSIS — D2272 Melanocytic nevi of left lower limb, including hip: Secondary | ICD-10-CM | POA: Diagnosis not present

## 2023-03-04 DIAGNOSIS — D2271 Melanocytic nevi of right lower limb, including hip: Secondary | ICD-10-CM | POA: Diagnosis not present

## 2023-03-04 DIAGNOSIS — L570 Actinic keratosis: Secondary | ICD-10-CM | POA: Diagnosis not present

## 2023-03-04 DIAGNOSIS — D2261 Melanocytic nevi of right upper limb, including shoulder: Secondary | ICD-10-CM | POA: Diagnosis not present

## 2023-03-19 DIAGNOSIS — I10 Essential (primary) hypertension: Secondary | ICD-10-CM | POA: Diagnosis not present

## 2023-03-19 DIAGNOSIS — K219 Gastro-esophageal reflux disease without esophagitis: Secondary | ICD-10-CM | POA: Diagnosis not present

## 2023-03-19 DIAGNOSIS — Z0000 Encounter for general adult medical examination without abnormal findings: Secondary | ICD-10-CM | POA: Diagnosis not present

## 2023-03-19 DIAGNOSIS — Z23 Encounter for immunization: Secondary | ICD-10-CM | POA: Diagnosis not present

## 2023-03-19 DIAGNOSIS — N1831 Chronic kidney disease, stage 3a: Secondary | ICD-10-CM | POA: Diagnosis not present

## 2023-03-19 DIAGNOSIS — Z1331 Encounter for screening for depression: Secondary | ICD-10-CM | POA: Diagnosis not present

## 2023-03-19 DIAGNOSIS — G309 Alzheimer's disease, unspecified: Secondary | ICD-10-CM | POA: Diagnosis not present

## 2023-03-19 DIAGNOSIS — R7303 Prediabetes: Secondary | ICD-10-CM | POA: Diagnosis not present

## 2023-03-19 DIAGNOSIS — M810 Age-related osteoporosis without current pathological fracture: Secondary | ICD-10-CM | POA: Diagnosis not present

## 2023-03-19 DIAGNOSIS — E782 Mixed hyperlipidemia: Secondary | ICD-10-CM | POA: Diagnosis not present

## 2023-03-20 ENCOUNTER — Ambulatory Visit: Payer: Medicare PPO | Admitting: Physician Assistant

## 2023-03-20 ENCOUNTER — Encounter: Payer: Self-pay | Admitting: Physician Assistant

## 2023-03-20 VITALS — HR 64 | Resp 18 | Wt 152.0 lb

## 2023-03-20 DIAGNOSIS — G309 Alzheimer's disease, unspecified: Secondary | ICD-10-CM | POA: Diagnosis not present

## 2023-03-20 DIAGNOSIS — F0280 Dementia in other diseases classified elsewhere without behavioral disturbance: Secondary | ICD-10-CM

## 2023-03-20 MED ORDER — DONEPEZIL HCL 10 MG PO TABS: ORAL_TABLET | ORAL | 3 refills | Status: AC

## 2023-03-20 NOTE — Patient Instructions (Addendum)
 It was a pleasure to see you today at our office.   Recommendations:  Continue Memantine 10 mg  2 times a day  Start Donepezil 10 mg :Take half tablet (5 mg) daily for 2 weeks, then increase to the full tablet at 10 mg daily.  Follow up 6  months   RECOMMENDATIONS FOR ALL PATIENTS WITH MEMORY PROBLEMS: 1. Continue to exercise (Recommend 30 minutes of walking everyday, or 3 hours every week) 2. Increase social interactions - continue going to [place] and enjoy social gatherings with friends and family 3. Eat healthy, avoid fried foods and eat more fruits and vegetables 4. Maintain adequate blood pressure, blood sugar, and blood cholesterol level. Reducing the risk of stroke and cardiovascular disease also helps promoting better memory. 5. Avoid stressful situations. Live a simple life and avoid aggravations. Organize your time and prepare for the next day in anticipation. 6. Sleep well, avoid any interruptions of sleep and avoid any distractions in the bedroom that may interfere with adequate sleep quality 7. Avoid sugar, avoid sweets as there is a strong link between excessive sugar intake, diabetes, and cognitive impairment We discussed the Mediterranean diet, which has been shown to help patients reduce the risk of progressive memory disorders and reduces cardiovascular risk. This includes eating fish, eat fruits and green leafy vegetables, nuts like almonds and hazelnuts, walnuts, and also use olive oil. Avoid fast foods and fried foods as much as possible. Avoid sweets and sugar as sugar use has been linked to worsening of memory function.  There is always a concern of gradual progression of memory problems. If this is the case, then we may need to adjust level of care according to patient needs. Support, both to the patient and caregiver, should then be put into place.     FALL PRECAUTIONS: Be cautious when walking. Scan the area for obstacles that may increase the risk of trips and falls.  When getting up in the mornings, sit up at the edge of the bed for a few minutes before getting out of bed. Consider elevating the bed at the head end to avoid drop of blood pressure when getting up. Walk always in a well-lit room (use night lights in the walls). Avoid area rugs or power cords from appliances in the middle of the walkways. Use a walker or a cane if necessary and consider physical therapy for balance exercise. Get your eyesight checked regularly.  FINANCIAL OVERSIGHT: Supervision, especially oversight when making financial decisions or transactions is also recommended.  HOME SAFETY: Consider the safety of the kitchen when operating appliances like stoves, microwave oven, and blender. Consider having supervision and share cooking responsibilities until no longer able to participate in those. Accidents with firearms and other hazards in the house should be identified and addressed as well.   ABILITY TO BE LEFT ALONE: If patient is unable to contact 911 operator, consider using LifeLine, or when the need is there, arrange for someone to stay with patients. Smoking is a fire hazard, consider supervision or cessation. Risk of wandering should be assessed by caregiver and if detected at any point, supervision and safe proof recommendations should be instituted.  MEDICATION SUPERVISION: Inability to self-administer medication needs to be constantly addressed. Implement a mechanism to ensure safe administration of the medications.   Mediterranean Diet A Mediterranean diet refers to food and lifestyle choices that are based on the traditions of countries located on the [employer]. This way of eating has been shown to help  prevent certain conditions and improve outcomes for people who have chronic diseases, like kidney disease and heart disease. What are tips for following this plan? Lifestyle  Cook and eat meals together with your family, when possible. Drink enough fluid to keep your  urine clear or pale yellow. Be physically active every day. This includes: Aerobic exercise like running or swimming. Leisure activities like gardening, walking, or housework. Get 7-8 hours of sleep each night. If recommended by your health care provider, drink red wine in moderation. This means 1 glass a day for nonpregnant women and 2 glasses a day for men. A glass of wine equals 5 oz (150 mL). Reading food labels  Check the serving size of packaged foods. For foods such as rice and pasta, the serving size refers to the amount of cooked product, not dry. Check the total fat in packaged foods. Avoid foods that have saturated fat or trans fats. Check the ingredients list for added sugars, such as corn syrup. Shopping  At the grocery store, buy most of your food from the areas near the walls of the store. This includes: Fresh fruits and vegetables (produce). Grains, beans, nuts, and seeds. Some of these may be available in unpackaged forms or large amounts (in bulk). Fresh seafood. Poultry and eggs. Low-fat dairy products. Buy whole ingredients instead of prepackaged foods. Buy fresh fruits and vegetables in-season from local farmers markets. Buy frozen fruits and vegetables in resealable bags. If you do not have access to quality fresh seafood, buy precooked frozen shrimp or canned fish, such as tuna, salmon, or sardines. Buy small amounts of raw or cooked vegetables, salads, or olives from the deli or salad bar at your store. Stock your pantry so you always have certain foods on hand, such as olive oil, canned tuna, canned tomatoes, rice, pasta, and beans. Cooking  Cook foods with extra-virgin olive oil instead of using butter or other vegetable oils. Have meat as a side dish, and have vegetables or grains as your main dish. This means having meat in small portions or adding small amounts of meat to foods like pasta or stew. Use beans or vegetables instead of meat in common dishes like  chili or lasagna. Experiment with different cooking methods. Try roasting or broiling vegetables instead of steaming or sauteing them. Add frozen vegetables to soups, stews, pasta, or rice. Add nuts or seeds for added healthy fat at each meal. You can add these to yogurt, salads, or vegetable dishes. Marinate fish or vegetables using olive oil, lemon juice, garlic, and fresh herbs. Meal planning  Plan to eat 1 vegetarian meal one day each week. Try to work up to 2 vegetarian meals, if possible. Eat seafood 2 or more times a week. Have healthy snacks readily available, such as: Vegetable sticks with hummus. Greek yogurt. Fruit and nut trail mix. Eat balanced meals throughout the week. This includes: Fruit: 2-3 servings a day Vegetables: 4-5 servings a day Low-fat dairy: 2 servings a day Fish, poultry, or lean meat: 1 serving a day Beans and legumes: 2 or more servings a week Nuts and seeds: 1-2 servings a day Whole grains: 6-8 servings a day Extra-virgin olive oil: 3-4 servings a day Limit red meat and sweets to only a few servings a month What are my food choices? Mediterranean diet Recommended Grains: Whole-grain pasta. Brown rice. Bulgar wheat. Polenta. Couscous. Whole-wheat bread. Orpah Cobb. Vegetables: Artichokes. Beets. Broccoli. Cabbage. Carrots. Eggplant. Green beans. Chard. Kale. Spinach. Onions. Leeks. Peas. Squash. Tomatoes.  Peppers. Radishes. Fruits: Apples. Apricots. Avocado. Berries. Bananas. Cherries. Dates. Figs. Grapes. Lemons. Melon. Oranges. Peaches. Plums. Pomegranate. Meats and other protein foods: Beans. Almonds. Sunflower seeds. Pine nuts. Peanuts. Cod. Salmon. Scallops. Shrimp. Tuna. Tilapia. Clams. Oysters. Eggs. Dairy: Low-fat milk. Cheese. Greek yogurt. Beverages: Water. Red wine. Herbal tea. Fats and oils: Extra virgin olive oil. Avocado oil. Grape seed oil. Sweets and desserts: [place] yogurt with honey. Baked apples. Poached pears. Trail  mix. Seasoning and other foods: Basil. Cilantro. Coriander. Cumin. Mint. Parsley. Sage. Rosemary. Tarragon. Garlic. Oregano. Thyme. Pepper. Balsalmic vinegar. Tahini. Hummus. Tomato sauce. Olives. Mushrooms. Limit these Grains: Prepackaged pasta or rice dishes. Prepackaged cereal with added sugar. Vegetables: Deep fried potatoes (french fries). Fruits: Fruit canned in syrup. Meats and other protein foods: Beef. Pork. Lamb. Poultry with skin. Hot dogs. Tomasa Blase. Dairy: Ice cream. Sour cream. Whole milk. Beverages: Juice. Sugar-sweetened soft drinks. Beer. Liquor and spirits. Fats and oils: Butter. Canola oil. Vegetable oil. Beef fat (tallow). Lard. Sweets and desserts: Cookies. Cakes. Pies. Candy. Seasoning and other foods: Mayonnaise. Premade sauces and marinades. The items listed may not be a complete list. Talk with your dietitian about what dietary choices are right for you. Summary The Mediterranean diet includes both food and lifestyle choices. Eat a variety of fresh fruits and vegetables, beans, nuts, seeds, and whole grains. Limit the amount of red meat and sweets that you eat. Talk with your health care provider about whether it is safe for you to drink red wine in moderation. This means 1 glass a day for nonpregnant women and 2 glasses a day for men. A glass of wine equals 5 oz (150 mL). This information is not intended to replace advice given to you by your health care provider. Make sure you discuss any questions you have with your health care provider. Document Released: 08/24/2015 Document Revised: 09/26/2015 Document Reviewed: 08/24/2015 Elsevier Interactive Patient Education  2017 [employer].

## 2023-03-20 NOTE — Progress Notes (Signed)
 Assessment/Plan:   Dementia likely due to Alzheimer disease   Anne Ford is a very pleasant 83 y.o. RH female with a history of hypertension, hyperlipidemia, vitamin D deficiency, prediabetes, paresthesias with negative workup for neuropathy, and a history of dementia likely due to Alzheimer's disease seen today in follow up for memory loss. Patient is currently on memantine 10 mg twice daily. Cognitive decline is noted, discussed adding donepezil to the regimen, daughter agrees.  He is able to participate on her ADLs, no longer drives.    Follow up in  6 months. Continue memantine 10 mg twice daily Start donepezil 10 mg daily as directed, side effects discussed. Continue B12 supplements Recommend good control of her cardiovascular risk factors Continue to control mood as per PCP     Subjective:    This patient is accompanied in the office by her daughter en on 09/23/2022 with MMSE 22/30   Any changes in memory since last visit? "Better"-daughter reports there are some good and bad days.  She enjoys reading the Bible about 4 hours a day, likes to go to [place], mow the yard, walks frequently. A new great grandson was born and she still remembers the newborn, but she did not remember the name of the older great-grandson.  She has difficulty appointments will take as before, uses a calendar for assistance.  LTM is fair. repeats oneself?  Endorsed Disoriented when walking into a room?  Patient denies   Leaving objects?  Endorsed, but not in unusual places.   Wandering behavior?  denies.  Uses the cane for stability Any personality changes since last visit?  Sometimes she has moments of sadness due to memory issues and her husband encourages her. Any worsening depression?:  Denies.   Hallucinations or paranoia?  Denies.   Seizures? denies    Any sleep changes? Sleeps well.  Denies vivid dreams, REM behavior or sleepwalking   Sleep apnea?   Denies.   Any hygiene concerns? Denies.   Independent of bathing and dressing?  Endorsed  Does the patient needs help with medications?  Patient is in charge, husband monitors Who is in charge of the finances?  Husband is in charge    Any changes in appetite?  Denies. Drinks enough water.      Patient have trouble swallowing? Denies.   Does the patient cook?  She still cooks with husband supervision, likes to make pound cakes and devil eggs, denies forgetting recipes Any headaches?   denies   Chronic back pain  denies   Ambulates with difficulty? Denies.    Recent falls or head injuries? denies     Unilateral weakness, numbness or tingling? denies   Any tremors?  Denies   Any anosmia?  Denies   Any incontinence of urine?  Endorsed, stress incontinence, uses pads.  Any bowel dysfunction?   Denies      Patient lives with her husband Does the patient drive? No longer drives    Initial visit 02/15/21  The patient is seen in neurologic consultation at the request of Anne Housekeeper, MD for the evaluation of memory.  The patient is accompanied by  who supplements the history. This is a 83 y.o. year old RH  female who has had memory issues for about 1.5 years, worse over the last 2 months.  During the last Thanksgiving, her family noticed that she was having worsening of memory and speech disturbance.  Church members began to notice as well, when she was repeating the  same stories, asking the same questions.  Her PCP placed her on memantine 5 mg daily, then increasing it to 10 mg daily which "for a while it helped especially with the slurring ", but over the last 2 weeks, he increase it to 10 mg twice daily without significant improvement.  Her speech continues to be slow, but family reports that this is better than prior.  She becomes very upset because she has always been very organized, in charge of every document in the house, and now she is having problems and trying to stay focused and organized.  She denies being confused when entering the  room, or leaving objects in unusual places.  She continues to drive but short distances, because over the last 6 months, if not using the GPS she will become lost if driving to unknown locations.  This has brought significant amount of frustration on her.  She becomes tearful at times, when she thinks that she could have Alzheimer's disease, as she took care of her mother with Alzheimer's when she was 79 years old.  She continues to read and do crossword puzzles and word findings.  She sleeps well, denies vivid dreams, sleepwalking, hallucinations or paranoia.  She is independent of bathing and dressing, there are no hygiene concerns.  She puts the medications on a pillbox (she could not think of the word "pillbox "), and checks a calendar frequently did not forget appointments.  Her husband has taken over the finances since last month.  Her appetite is good, denies trouble swallowing.  She does not cook very often.  She ambulates without a cane, but she had a couple of falls, one in June due to heat exhaustion resulting in presyncope, and another mechanical fall hitting the right side of her head in November 2022 without loss of consciousness.  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  She denies focal numbness.  She denies a history of stroke.  She denies using the computer on excessive basis.  Denies hormonal therapy, or long distance trips.  Denies chest pain or palpitations.  Denies shortness of breath.  Denies a history of tremors or anosmia.  No history of seizures.  Denies urine frequency, but she does have mild incontinence.  Denies constipation or diarrhea.  Denies a history of sleep apnea, alcohol or tobacco.  States she has strong family history of  Alzheimer's disease in mother, 2 sisters and maternal grandmother.   labs 02/09/2021 shows abnormal lipid panel with TC 253, triglycerides 218, LDL 154 CMP calcium 10.8, creatinine 1.35. CBC was normal   CT scan of the head 12/22/2020 remarkable for no intracranial abnormality. Mild generalized cerebral and cerebellar atrophy.  Mild right ethmoid sinus mucosal thickening.   MRI brain 03/03/21 No acute or reversible finding. Generalized age related volume loss without lobar predominance. Mild chronic small-vessel ischemic changes of the pons and cerebral hemispheric white matter.   CT head 2/6/23No acute intracranial abnormality seen.  PREVIOUS MEDICATIONS:   CURRENT MEDICATIONS:  Outpatient Encounter Medications as of 03/20/2023  Medication Sig   Acetaminophen (TYLENOL ARTHRITIS EXT RELIEF PO) Take 1 tablet by mouth as needed (pain).   atenolol (TENORMIN) 25 MG tablet Take 25 mg by mouth daily.   Cyanocobalamin (  VITAMIN B-12 PO) Take 1 capsule by mouth daily.   donepezil (ARICEPT) 10 MG tablet Take half tablet (5 mg) daily for 2 weeks, then increase to One  tablet at 10 mg daily.   felodipine (PLENDIL) 5 MG 24 hr tablet Take 5 mg by mouth daily.   fluticasone (FLONASE) 50 MCG/ACT nasal spray Place 2 sprays into both nostrils daily.   hydrochlorothiazide (HYDRODIURIL) 25 MG tablet Take 25 mg by mouth daily. Take at 6 pm   ibuprofen (ADVIL) 200 MG tablet Take 200 mg by mouth every 6 (six) hours as needed for mild pain or headache.   loratadine (CLARITIN) 10 MG tablet Take 10 mg by mouth daily.   memantine (NAMENDA) 10 MG tablet Take 10 mg by mouth 2 (two) times daily.   Polyvinyl Alcohol-Povidone (REFRESH OP) Apply 2 drops to eye 3 (three) times daily as needed (dryness).   No facility-administered encounter medications on file as of 03/20/2023.       03/20/2023    2:00 PM 09/23/2022   11:00 AM 03/20/2022   11:00 AM  MMSE - Mini Mental State Exam  Orientation to time 2 1 3   Orientation to  Place 1 5 3   Registration 3 3 3   Attention/ Calculation 4 4 5   Recall 0 1 0  Language- name 2 objects 2 2 2   Language- repeat 1 1 1   Language- follow 3 step command 3 3 3   Language- read & follow direction 1 1 1   Write a sentence 1 1 1   Copy design 1 0 0  Total score 19 22 22       02/16/2021    8:00 AM  Montreal Cognitive Assessment   Visuospatial/ Executive (0/5) 3  Naming (0/3) 1  Attention: Read list of digits (0/2) 2  Attention: Read list of letters (0/1) 1  Attention: Serial 7 subtraction starting at 100 (0/3) 0  Language: Repeat phrase (0/2) 2  Language : Fluency (0/1) 1  Abstraction (0/2) 0  Delayed Recall (0/5) 0  Orientation (0/6) 5  Total 15  Adjusted Score (based on education) 15    Objective:     PHYSICAL EXAMINATION:    VITALS:   Vitals:   03/20/23 1301  Pulse: 64  Resp: 18  SpO2: 98%  Weight: 152 lb (68.9 kg)    GEN:  The patient appears stated age and is in NAD. HEENT:  Normocephalic, atraumatic.   Neurological examination:  General: NAD, well-groomed, appears stated age. Orientation: The patient is alert. Oriented to person, place and not to date Cranial nerves: There is good facial symmetry.The speech is not fluent but clear. No aphasia or dysarthria. Fund of knowledge is appropriate. Recent and remote memory are impaired. Attention and concentration are reduced.  Able to name objects and repeat phrases.  Hearing is intact to conversational tone.  Sensation: Sensation is intact to light touch throughout Motor: Strength is at least antigravity x4. DTR's 2/4 in UE/LE     Movement examination: Tone: There is normal tone in the UE/LE Abnormal movements:  no tremor.  No myoclonus.  No asterixis.   Coordination:  There is no decremation with RAM's. Normal finger to nose  Gait and Station: The patient has no  difficulty arising out of a deep-seated chair without the use of the hands. The patient's stride length is good.  Gait is cautious and  narrow.    Thank you for allowing [place] the opportunity to participate in the care of this nice patient. Please  do not hesitate to contact us for any questions or concerns.   Total time spent on today's visit was 35 minutes dedicated to this patient today, preparing to see patient, examining the patient, ordering tests and/or medications and counseling the patient, documenting clinical information in the EHR or other health record, independently interpreting results and communicating results to the patient/family, discussing treatment and goals, answering patient's questions and coordinating care.  Cc:  Anne Housekeeper, MD  Marlowe Kays 03/20/2023 2:33 PM

## 2023-03-24 ENCOUNTER — Ambulatory Visit: Payer: Medicare PPO | Admitting: Physician Assistant

## 2023-08-05 ENCOUNTER — Ambulatory Visit: Admitting: Physician Assistant

## 2023-08-05 ENCOUNTER — Encounter: Payer: Self-pay | Admitting: Physician Assistant

## 2023-08-05 VITALS — BP 175/80 | HR 70 | Resp 20 | Wt 150.0 lb

## 2023-08-05 DIAGNOSIS — R1319 Other dysphagia: Secondary | ICD-10-CM | POA: Diagnosis not present

## 2023-08-05 DIAGNOSIS — G309 Alzheimer's disease, unspecified: Secondary | ICD-10-CM | POA: Diagnosis not present

## 2023-08-05 DIAGNOSIS — F0280 Dementia in other diseases classified elsewhere without behavioral disturbance: Secondary | ICD-10-CM

## 2023-08-05 NOTE — Progress Notes (Addendum)
 Assessment/Plan:   Dementia due to Alzheimer's disease  Anne Ford is a very pleasant 83 y.o. RH female with a history of hypertension, hyperlipidemia, vitamin D deficiency, prediabetes, paresthesias with negative workup for neuropathy, and a history of dementia likely due to Alzheimer's disease seen today in follow up for memory loss. Patient is currently on memantine 10 mg twice daily . She discontinued donepezil  as she was unable to tolerate it it was making her more confused -daughter says.  Patient is able to participate on ADLs, no longer drives.  Mood is stable     Follow up in 6 months. Continue memantine 10 mg twice daily side effects discussed Referral to speech therapy for speech difficulties, rule out aphasia Repeat MRI of the brain to rule out any structural abnormalities and further evaluate vascular load Recommend good control of her cardiovascular risk factors Continue to control mood as per PCP    Initial visit 02/15/21  The patient is seen in neurologic consultation at the request of Husain, Karrar, MD for the evaluation of memory.  The patient is accompanied by  who supplements the history. This is a 83 y.o. year old RH  female who has had memory issues for about 1.5 years, worse over the last 2 months.  During the last Thanksgiving, her family noticed that she was having worsening of memory and speech disturbance.  Church members began to notice as well, when she was repeating the same stories, asking the same questions.  Her PCP placed her on memantine 5 mg daily, then increasing it to 10 mg daily which for a while it helped especially with the slurring , but over the last 2 weeks, he increase it to 10 mg twice daily without significant improvement.  Her speech continues to be slow, but family reports that this is better than prior.  She becomes very upset because she has always been very organized, in charge of every document in the house, and now she is having  problems and trying to stay focused and organized.  She denies being confused when entering the room, or leaving objects in unusual places.  She continues to drive but short distances, because over the last 6 months, if not using the GPS she will become lost if driving to unknown locations.  This has brought significant amount of frustration on her.  She becomes tearful at times, when she thinks that she could have Alzheimer's disease, as she took care of her mother with Alzheimer's when she was 21 years old.  She continues to read and do crossword puzzles and word findings.  She sleeps well, denies vivid dreams, sleepwalking, hallucinations or paranoia.  She is independent of bathing and dressing, there are no hygiene concerns.  She puts the medications on a pillbox (she could not think of the word pillbox ), and checks a calendar frequently did not forget appointments.  Her husband has taken over the finances since last month.  Her appetite is good, denies trouble swallowing.  She does not cook very often.  She ambulates without a cane, but she had a couple of falls, one in June due to heat exhaustion resulting in presyncope, and another mechanical fall hitting the right side of her head in November 2022 without loss of consciousness.  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  She denies focal numbness.  She denies a history of stroke.  She denies using the computer on excessive basis.  Denies hormonal therapy, or long distance trips.  Denies chest pain or palpitations.  Denies shortness of breath.  Denies a history of tremors or anosmia.  No history of seizures.  Denies urine frequency, but she does have mild incontinence.  Denies constipation or  diarrhea.  Denies a history of sleep apnea, alcohol or tobacco.  States she has strong family history of Alzheimer's disease in mother, 2 sisters and maternal grandmother.   labs 02/09/2021 shows abnormal lipid panel with TC 253, triglycerides 218, LDL 154 CMP calcium 10.8, creatinine 1.35. CBC was normal   CT scan of the head 12/22/2020 remarkable for no intracranial abnormality. Mild generalized cerebral and cerebellar atrophy.  Mild right ethmoid sinus mucosal thickening.   MRI brain 03/03/21 No acute or reversible finding. Generalized age related volume loss without lobar predominance. Mild chronic small-vessel ischemic changes of the pons and cerebral hemispheric white matter.   CT head 2/6/23No acute intracranial abnormality seen.  Subjective:    This patient is accompanied in the office by her daughter, her husband who supplements the history.  Previous records as well as any outside records available were reviewed prior to todays visit. Patient was last seen on 03/20/2023.   Any changes in memory since last visit?  Some good days and some not so good days,.  She enjoys reading the Bible about 4 hours a day, likes going to church, mow the yard, walking frequently.  Sometimes she has difficulty with names of people. She has some difficulty with appointments and needs a calendar for assistance.  LTM is fair. repeats oneself?  Endorsed Disoriented when walking into a room? Denies    Leaving objects?  May misplace things but not in unusual places   Wandering behavior?  denies   Any personality changes since last visit?  Denies.   Any worsening depression?:  She may have some moments of feeling down, when she is aware of her memory issues, but her family encourages her.  Hallucinations or paranoia?  Denies.   Seizures? denies    Any sleep changes?  Sleeps fairly well.  Denies vivid dreams, REM behavior or sleepwalking   Sleep apnea?   Denies.   Any hygiene concerns? Denies.  Independent  of bathing and dressing?  Endorsed  Does the patient needs help with medications?  Patient is in charge, husband monitors  Who is in charge of the finances?  Husband is in charge     Any changes in appetite?  denies.  Drinks plenty water.    Patient have trouble swallowing? Denies.   Does the patient cook?  Yes, with her husband supervision, likes to make pancakes and double eggs, denies forgetting her recipes.  Denies kitchen accidents Any headaches?   denies   Any vision changes?  Denies Chronic back pain  denies   Ambulates with difficulty? Denies.  Uses a cane for stability  Recent falls or head injuries? Denies.     Unilateral weakness, numbness or tingling?  She has bilateral paresthesias of the feet, which had been studied in the past, with negative workup for neuropathy.  Etiology is unclear. Any tremors?  Denies    Any anosmia?  Denies   Any incontinence of urine?  Endorsed, stress incontinence, uses pads   Any bowel dysfunction?  Denies      Patient lives with her husband  Does the patient drive? No longer drives     PREVIOUS MEDICATIONS: Donepezil  (increased confusion)  CURRENT MEDICATIONS:  Outpatient Encounter Medications as of 08/05/2023  Medication Sig   Acetaminophen  (TYLENOL  ARTHRITIS EXT RELIEF PO) Take 1 tablet by mouth as needed (pain).   atenolol (TENORMIN) 25 MG tablet Take 25 mg by mouth daily.   Cyanocobalamin  (VITAMIN B-12 PO) Take 1 capsule by mouth daily.   donepezil  (ARICEPT ) 10 MG tablet Take half tablet (5 mg) daily for 2 weeks, then increase to One  tablet at 10 mg daily.   felodipine (PLENDIL) 5 MG 24 hr tablet Take 5 mg by mouth daily.   fluticasone (FLONASE) 50 MCG/ACT nasal spray Place 2 sprays into both nostrils daily.   hydrochlorothiazide (HYDRODIURIL) 25 MG tablet Take 25 mg by mouth daily. Take at 6 pm   ibuprofen (ADVIL) 200 MG tablet Take 200 mg by mouth every 6 (six) hours as needed for mild pain or headache.   loratadine (CLARITIN) 10 MG  tablet Take 10 mg by mouth daily.   memantine (NAMENDA) 10 MG tablet Take 10 mg by mouth 2 (two) times daily.   Polyvinyl Alcohol-Povidone (REFRESH OP) Apply 2 drops to eye 3 (three) times daily as needed (dryness).   No facility-administered encounter medications on file as of 08/05/2023.       03/20/2023    2:00 PM 09/23/2022   11:00 AM 03/20/2022   11:00 AM  MMSE - Mini Mental State Exam  Orientation to time 2 1 3   Orientation to Place 1 5 3   Registration 3 3 3   Attention/ Calculation 4 4 5   Recall 0 1 0  Language- name 2 objects 2 2 2   Language- repeat 1 1 1   Language- follow 3 step command 3 3 3   Language- read & follow direction 1 1 1   Write a sentence 1 1 1   Copy design 1 0 0  Total score 19 22 22       02/16/2021    8:00 AM  Montreal Cognitive Assessment   Visuospatial/ Executive (0/5) 3  Naming (0/3) 1  Attention: Read list of digits (0/2) 2  Attention: Read list of letters (0/1) 1  Attention: Serial 7 subtraction starting at 100 (0/3) 0  Language: Repeat phrase (0/2) 2  Language : Fluency (0/1) 1  Abstraction (0/2) 0  Delayed Recall (0/5) 0  Orientation (0/6) 5  Total 15  Adjusted Score (based on education) 15    Objective:     PHYSICAL EXAMINATION:    VITALS:   Vitals:   08/05/23 1456  BP: (!) 175/80  Pulse: 70  Resp: 20  SpO2: 96%  Weight: 150 lb (68 kg)    GEN:  The patient appears stated age and is in NAD. HEENT:  Normocephalic, atraumatic.   Neurological examination:  General: NAD, well-groomed, appears stated age. Orientation: The patient is alert. Oriented to person, place and not to date Cranial nerves: There is good facial symmetry.The speech is not as fluent, slower than prior, but clear.  Suspected mild aphasia, no dysarthria. Fund of knowledge is appropriate. Recent and remote memory are impaired. Attention and concentration are reduced. Able to name objects and repeat phrases.  Hearing is intact to conversational tone.   Sensation:  Sensation is intact to light touch throughout Motor: Strength is at least antigravity x4. DTR's 2/4 in UE/LE     Movement examination: Tone: There is normal  tone in the UE/LE Abnormal movements:  no tremor.  No myoclonus.  No asterixis.   Coordination:  There is no decremation with RAM's. Normal finger to nose  Gait and Station: The patient has no difficulty arising out of a deep-seated chair without the use of the hands. The patient's stride length is good, but steps are slower.  Gait is cautious and narrow.    Thank you for allowing us  the opportunity to participate in the care of this nice patient. Please do not hesitate to contact us  for any questions or concerns.   Total time spent on today's visit was 33 minutes dedicated to this patient today, preparing to see patient, examining the patient, ordering tests and/or medications and counseling the patient, documenting clinical information in the EHR or other health record, independently interpreting results and communicating results to the patient/family, discussing treatment and goals, answering patient's questions and coordinating care.  Cc:  Ransom Other, MD  Camie Sevin 08/05/2023 4:58 PM

## 2023-08-05 NOTE — Patient Instructions (Addendum)
 It was a pleasure to see you today at our office.   Recommendations:  Continue Memantine 10 mg  2 times a day  Follow up 6  months  Referral to speech therapy at [facility] at [facility]  RECOMMENDATIONS FOR ALL PATIENTS WITH MEMORY PROBLEMS: 1. Continue to exercise (Recommend 30 minutes of walking everyday, or 3 hours every week) 2. Increase social interactions - continue going to [place] and enjoy social gatherings with friends and family 3. Eat healthy, avoid fried foods and eat more fruits and vegetables 4. Maintain adequate blood pressure, blood sugar, and blood cholesterol level. Reducing the risk of stroke and cardiovascular disease also helps promoting better memory. 5. Avoid stressful situations. Live a simple life and avoid aggravations. Organize your time and prepare for the next day in anticipation. 6. Sleep well, avoid any interruptions of sleep and avoid any distractions in the bedroom that may interfere with adequate sleep quality 7. Avoid sugar, avoid sweets as there is a strong link between excessive sugar intake, diabetes, and cognitive impairment We discussed the Mediterranean diet, which has been shown to help patients reduce the risk of progressive memory disorders and reduces cardiovascular risk. This includes eating fish, eat fruits and green leafy vegetables, nuts like almonds and hazelnuts, walnuts, and also use olive oil. Avoid fast foods and fried foods as much as possible. Avoid sweets and sugar as sugar use has been linked to worsening of memory function.  There is always a concern of gradual progression of memory problems. If this is the case, then we may need to adjust level of care according to patient needs. Support, both to the patient and caregiver, should then be put into place.     FALL PRECAUTIONS: Be cautious when walking. Scan the area for obstacles that may increase the risk of trips and falls. When getting up in the  mornings, sit up at the edge of the bed for a few minutes before getting out of bed. Consider elevating the bed at the head end to avoid drop of blood pressure when getting up. Walk always in a well-lit room (use night lights in the walls). Avoid area rugs or power cords from appliances in the middle of the walkways. Use a walker or a cane if necessary and consider physical therapy for balance exercise. Get your eyesight checked regularly.  FINANCIAL OVERSIGHT: Supervision, especially oversight when making financial decisions or transactions is also recommended.  HOME SAFETY: Consider the safety of the kitchen when operating appliances like stoves, microwave oven, and blender. Consider having supervision and share cooking responsibilities until no longer able to participate in those. Accidents with firearms and other hazards in the house should be identified and addressed as well.   ABILITY TO BE LEFT ALONE: If patient is unable to contact 911 operator, consider using LifeLine, or when the need is there, arrange for someone to stay with patients. Smoking is a fire hazard, consider supervision or cessation. Risk of wandering should be assessed by caregiver and if detected at any point, supervision and safe proof recommendations should be instituted.  MEDICATION SUPERVISION: Inability to self-administer medication needs to be constantly addressed. Implement a mechanism to ensure safe administration of the medications.   Mediterranean Diet A Mediterranean diet refers to food and lifestyle choices that are based on the traditions of countries located on the [employer] [place]. This way of eating has been shown to help prevent certain conditions and improve outcomes for people who have chronic  diseases, like kidney disease and heart disease. What are tips for following this plan? Lifestyle  Cook and eat meals together with your family, when possible. Drink enough fluid to keep your urine clear or pale  yellow. Be physically active every day. This includes: Aerobic exercise like running or swimming. Leisure activities like gardening, walking, or housework. Get 7-8 hours of sleep each night. If recommended by your health care provider, drink red wine in moderation. This means 1 glass a day for nonpregnant women and 2 glasses a day for men. A glass of wine equals 5 oz (150 mL). Reading food labels  Check the serving size of packaged foods. For foods such as rice and pasta, the serving size refers to the amount of cooked product, not dry. Check the total fat in packaged foods. Avoid foods that have saturated fat or trans fats. Check the ingredients list for added sugars, such as corn syrup. Shopping  At the grocery store, buy most of your food from the areas near the walls of the store. This includes: Fresh fruits and vegetables (produce). Grains, beans, nuts, and seeds. Some of these may be available in unpackaged forms or large amounts (in bulk). Fresh seafood. Poultry and eggs. Low-fat dairy products. Buy whole ingredients instead of prepackaged foods. Buy fresh fruits and vegetables in-season from local farmers markets. Buy frozen fruits and vegetables in resealable bags. If you do not have access to quality fresh seafood, buy precooked frozen shrimp or canned fish, such as tuna, salmon, or sardines. Buy small amounts of raw or cooked vegetables, salads, or olives from the deli or salad bar at your store. Stock your pantry so you always have certain foods on hand, such as olive oil, canned tuna, canned tomatoes, rice, pasta, and beans. Cooking  Cook foods with extra-virgin olive oil instead of using butter or other vegetable oils. Have meat as a side dish, and have vegetables or grains as your main dish. This means having meat in small portions or adding small amounts of meat to foods like pasta or stew. Use beans or vegetables instead of meat in common dishes like chili or  lasagna. Experiment with different cooking methods. Try roasting or broiling vegetables instead of steaming or sauteing them. Add frozen vegetables to soups, stews, pasta, or rice. Add nuts or seeds for added healthy fat at each meal. You can add these to yogurt, salads, or vegetable dishes. Marinate fish or vegetables using olive oil, lemon juice, garlic, and fresh herbs. Meal planning  Plan to eat 1 vegetarian meal one day each week. Try to work up to 2 vegetarian meals, if possible. Eat seafood 2 or more times a week. Have healthy snacks readily available, such as: Vegetable sticks with hummus. Greek yogurt. Fruit and nut trail mix. Eat balanced meals throughout the week. This includes: Fruit: 2-3 servings a day Vegetables: 4-5 servings a day Low-fat dairy: 2 servings a day Fish, poultry, or lean meat: 1 serving a day Beans and legumes: 2 or more servings a week Nuts and seeds: 1-2 servings a day Whole grains: 6-8 servings a day Extra-virgin olive oil: 3-4 servings a day Limit red meat and sweets to only a few servings a month What are my food choices? Mediterranean diet Recommended Grains: Whole-grain pasta. Brown rice. Bulgar wheat. Polenta. Couscous. Whole-wheat bread. [place] Made[place]. Vegetables: Artichokes. Beets. Broccoli. Cabbage. Carrots. Eggplant. Green beans. Chard. Kale. Spinach. Onions. Leeks. Peas. Squash. Tomatoes. Peppers. Radishes. Fruits: Apples. Apricots. Avocado. Berries. Bananas. Cherries. Dates. Figs.  Grapes. Lemons. Melon. Oranges. Peaches. Plums. Pomegranate. Meats and other protein foods: Beans. Almonds. Sunflower seeds. Pine nuts. Peanuts. Cod. Salmon. Scallops. Shrimp. Tuna. Tilapia. Clams. Oysters. Eggs. Dairy: Low-fat milk. Cheese. Greek yogurt. Beverages: Water. Red wine. Herbal tea. Fats and oils: Extra virgin olive oil. Avocado oil. Grape seed oil. Sweets and desserts: [place] yogurt with honey. Baked apples. Poached pears. Trail mix. Seasoning and  other foods: Basil. Cilantro. Coriander. Cumin. Mint. Parsley. Sage. Rosemary. Tarragon. Garlic. Oregano. Thyme. Pepper. Balsalmic vinegar. Tahini. Hummus. Tomato sauce. Olives. Mushrooms. Limit these Grains: Prepackaged pasta or rice dishes. Prepackaged cereal with added sugar. Vegetables: Deep fried potatoes (french fries). Fruits: Fruit canned in syrup. Meats and other protein foods: Beef. Pork. Lamb. Poultry with skin. Hot dogs. [place]. Dairy: Ice cream. Sour cream. Whole milk. Beverages: Juice. Sugar-sweetened soft drinks. Beer. Liquor and spirits. Fats and oils: Butter. Canola oil. Vegetable oil. Beef fat (tallow). Lard. Sweets and desserts: Cookies. Cakes. Pies. Candy. Seasoning and other foods: Mayonnaise. Premade sauces and marinades. The items listed may not be a complete list. Talk with your dietitian about what dietary choices are right for you. Summary The Mediterranean diet includes both food and lifestyle choices. Eat a variety of fresh fruits and vegetables, beans, nuts, seeds, and whole grains. Limit the amount of red meat and sweets that you eat. Talk with your health care provider about whether it is safe for you to drink red wine in moderation. This means 1 glass a day for nonpregnant women and 2 glasses a day for men. A glass of wine equals 5 oz (150 mL). This information is not intended to replace advice given to you by your health care provider. Make sure you discuss any questions you have with your health care provider. Document Released: 08/24/2015 Document Revised: 09/26/2015 Document Reviewed: 08/24/2015 Elsevier Interactive Patient Education  2017 [employer].

## 2023-08-06 ENCOUNTER — Encounter: Payer: Self-pay | Admitting: Physician Assistant

## 2023-08-12 ENCOUNTER — Inpatient Hospital Stay
Admission: RE | Admit: 2023-08-12 | Discharge: 2023-08-12 | Disposition: A | Discharge status: Home / Self Care | Source: Ambulatory Visit | Attending: Physician Assistant | Admitting: Physician Assistant

## 2023-08-12 DIAGNOSIS — G309 Alzheimer's disease, unspecified: Secondary | ICD-10-CM | POA: Diagnosis not present

## 2023-08-12 DIAGNOSIS — F0280 Dementia in other diseases classified elsewhere without behavioral disturbance: Secondary | ICD-10-CM | POA: Diagnosis not present

## 2023-08-15 ENCOUNTER — Ambulatory Visit: Payer: Self-pay | Admitting: Neurology

## 2023-08-18 ENCOUNTER — Ambulatory Visit: Attending: Physician Assistant | Admitting: Speech Pathology

## 2023-08-18 DIAGNOSIS — R471 Dysarthria and anarthria: Secondary | ICD-10-CM | POA: Diagnosis not present

## 2023-08-18 DIAGNOSIS — R1319 Other dysphagia: Secondary | ICD-10-CM | POA: Insufficient documentation

## 2023-08-18 DIAGNOSIS — R41841 Cognitive communication deficit: Secondary | ICD-10-CM | POA: Insufficient documentation

## 2023-08-19 NOTE — Therapy (Signed)
 OUTPATIENT SPEECH LANGUAGE PATHOLOGY  EVALUATION   Patient Name: Anne Ford MRN: [id number] DOB:1940-11-29, 83 y.o., female Today's Date: 08/19/2023  PCP: Ardell Manly, MD REFERRING PROVIDER: Camie Sevin, PA-C   End of Session - 08/19/23 1228     Visit Number 1    Number of Visits 17    Date for SLP Re-Evaluation 10/14/23    Authorization Type [employer] Choice PPO    Progress Note Due on Visit 10    SLP Start Time 1445    SLP Stop Time  1540    SLP Time Calculation (min) 55 min    Activity Tolerance Patient tolerated treatment well          No past medical history on file.  The histories are not reviewed yet. Please review them in the History navigator section and refresh this SmartLink. Patient Active Problem List   Diagnosis Date Noted   Dementia due to Alzheimer's disease (HCC) 02/16/2021   Allergic rhinitis 02/15/2021   Essential hypertension 02/15/2021   Gastro-esophageal reflux disease without esophagitis 02/15/2021   Memory deficit 02/15/2021   Osteochondropathy 02/15/2021   Paresthesia 02/15/2021   Prediabetes 02/15/2021   Pure hypercholesterolemia 02/15/2021    ONSET DATE: Memory deficits documented on 02/15/2021; date of referral 08/05/2023  REFERRING DIAG: G30.9,F02.80 (ICD-10-CM) - Dementia due to Alzheimer's disease (HCC)   THERAPY DIAG:  Dysarthria and anarthria  Cognitive communication deficit  Rationale for Evaluation and Treatment Rehabilitation  SUBJECTIVE:   SUBJECTIVE STATEMENT: Pt pleasant, eager, speech noticeably impaired Pt accompanied by: family member pt's daughter  PERTINENT HISTORY: Pt is a 83 year old female with past medical history of hypertension, hyperlipidemia, vitamin D deficiency, prediabetes, anxiety with a history of dementia likely due to Alzheimer's disease.   Chart review reveals mention of other amnesia by pt's PCP on 12/13/2020 (MD notes not available on EPIC). Note dated 02/15/2021 from  neurology states MoCA today is 15/30, with deficiencies in delayed recall 0/5, abstraction, naming 1/3, and visuospatial 3/5. Patient is on memantine 10 mg twice daily by PCP.  NCV-EMG was negative without evidence of a large fiber sensorimotor polyneuropathy, cervical/lumbosacral radiculopathy, or carpal tunnel syndrome.   MMSE (Mini Mental Status Exam) 03/20/2022  Score: 22 09/23/2022 Score: 22 03/20/2023 Score: 19  DIAGNOSTIC FINDINGS:  Head CT 12/22/2020 Mild generalized cerebral and cerebellar atrophy.  Head CT 02/19/2021 Brain: No evidence of acute infarction, hemorrhage, hydrocephalus, extra-axial collection or mass lesion/mass effect. MRI 03/03/2021 d/t Gait disturbance and speech disturbance Brain: Diffusion imaging does not show any acute or subacute infarction. There is generalized age related volume loss. Chronic small-vessel ischemic changes affect the pons. No focal cerebellar insult. Cerebral hemispheres show mild chronic small-vessel ischemic change. No cortical or large vessel territory infarction. No mass lesion, hemorrhage, hydrocephalus or extra-axial collection. MRI 08/05/2023 1. No acute or reversible finding. Mild chronic small-vessel ischemic change of the cerebral hemispheric white matter, similar to the study of 03/03/2021. 2. Generalized brain atrophy, subjectively slightly progressive since 2023, though there is no subjective lobar predominance.  PAIN:  Are you having pain? No   FALLS: Has patient fallen in last 6 months?  No  LIVING ENVIRONMENT: Lives with: lives with their spouse Lives in: House/apartment  PLOF:  Level of assistance: Needed assistance with ADLs, Needed assistance with IADLS Employment: Retired   PATIENT GOALS   I don't want to be dependent  OBJECTIVE:   COGNITIVE COMMUNICATION: Overall cognitive status: Impaired difficult to formally assess d/t motor speech impairment -  needs further assessment Functional  communication: Impaired: ~ 25 to 50% speech intelligibility Functional deficits: pt states that she no longer drives and that her husband has taken over paying the bills. She forgets the names of people at church. She reports emailing her friend nearly everyday - see impression statement for more information  AUDITORY COMPREHENSION: Overall auditory comprehension: Appears intact YES/NO questions: Appears intact Following directions: Appears intact Conversation: Simple  READING COMPREHENSION: Intact  EXPRESSION: verbal  VERBAL EXPRESSION: Level of generative/spontaneous verbalization: sentence Automatic speech: name: intact and social response: intact  Repetition: Appears intact Naming: needs further assessment, suspect naming tasks to be impacted by delayed response times (suspect related to motor speech difficulty) Pragmatics: Impaired: abnormal effect, dysprosody, eye contact, and monotone Interfering components: speech intelligibility Non-verbal means of communication: N/A   WRITTEN EXPRESSION: Dominant hand: right   Written expression: slow effortful but intact spelling  MOTOR SPEECH: Overall motor speech: impaired Level of impairment: Word Respiration: diaphragmatic/abdominal breathing Phonation: breathy, hoarse, and strained Resonance: WFL Articulation: Impaired: word Intelligibility: Intelligibility reduced Motor planning: Impaired: aware and consistent Motor speech errors: aware and consistent Effective technique: none helpful during today's evaluation see treatment section for list of multi-modal communication that was trialed  ORAL MOTOR EXAMINATION: Facial : WFL Lingual: WFL Velum: WFL Mandible: WFL Cough: WFL Voice: Hoarse, Strained, Breathy, Weak   STANDARDIZED ASSESSMENTS: Formal cognitive assessment to be administered in the next session - focus was on motor speech impairment  PATIENT REPORTED OUTCOME MEASURES (PROM): To be completed over the next 3  sessions   TODAY'S TREATMENT:  Skilled treatment targeted potential use of multi-modal communication. SLP introduced Text to Speech app with pt able to able to type response (mild difficulty locating letters on iPad screen d/t lack of experience with electronics. Pt currently uses a flip phone). Pt able to physically write with good spelling but forming letters appeared slightly laborsome (per pt report). Pt was also able to use this writer keyboard to type sentence level response to question with good self-awareness of misspelling and good problem solving to correct.    PATIENT EDUCATION: Education details: results of this evaluation, ST POC Person educated: Patient and Child(ren) Education method: Explanation Education comprehension: needs further education  HOME EXERCISE PROGRAM:        N/A    GOALS:  Goals reviewed with patient? Yes  SHORT TERM GOALS: Target date: 10 sessions  To identify emerging areas of strength/need, patient will participate in ongoing diagnostic treatment (Mini Addenbrooke's Cognitive Examination, paragraph recall, functional reading/writing)  Baseline: Goal status: INITIAL    LONG TERM GOALS: Target date: 10/02/2023  To maximize preparedness for future changes, the patient will describe at least one pro-active strategy designed to prepare for typical changes associated with speech function Baseline:  Goal status: INITIAL  2.  To maximize communication success, the patient will maintain the ability to communicate successfully with familiar and unfamiliar communication partners throughout the progression of the disease  Baseline:  Goal status: INITIAL   ASSESSMENT:  CLINICAL IMPRESSION: Patient is a 83 y.o. right handed female who was seen today for a cognitive communication evaluation d/t memory loss and word finding difficulty secondary to dx of dementia due to Alzheimer's disease. Pt arrives with her daughter. While pt and her daughter do report  memory loss and word finding difficulties, time was spent during this evaluation on her speech disturbance. They report recent progression of speech distrubance over the last 4 months.   During this evaluation, pt presents  with profound motor speech impairment that reduced pt's speech intelligiblity is ~ 50%. Her speech disturbance is c/b slow effortful speech, reduced articulation and coordination of articulators, intermittent consonant deletion (des for desk), distorted vowels and difficulty producing multisyllabic words. In addition she also has a strained and breathy vocal quality, reduced pitch and loudness resulting on monotone speech. Lengthy response times (>10 seconds response time to questions and > 5 seconds between words within thought) appear related to motor movements not cognitive retrieval or word finding difficulty.   Additional testing for memory loss and word finding would be beneficial - to be completed in next session.    OBJECTIVE IMPAIRMENTS include memory, executive functioning, expressive language, and dysarthria. These impairments are limiting patient from managing medications, managing appointments, household responsibilities, ADLs/IADLs, and effectively communicating at home and in community. Factors affecting potential to achieve goals and functional outcome are medical prognosis and severity of impairments.. Patient will benefit from skilled SLP services to address above impairments and improve overall function.  REHAB POTENTIAL: Fair neurodegenerative disease process, severity of speech disturbance  PLAN: SLP FREQUENCY: 1-2x/week  SLP DURATION: 8 weeks  PLANNED INTERVENTIONS: Language facilitation, Environmental controls, Cueing hierachy, Cognitive reorganization, Internal/external aids, Functional tasks, Multimodal communication approach, SLP instruction and feedback, Compensatory strategies, and Patient/family education    Emri Sample B. Rubbie, M.S., CCC-SLP,  Tree surgeon Certified Brain Injury Specialist [facility]  [facility] Services Office [phone] Ascom [phone] Fax [phone]

## 2023-08-20 ENCOUNTER — Ambulatory Visit: Admitting: Speech Pathology

## 2023-08-20 DIAGNOSIS — R41841 Cognitive communication deficit: Secondary | ICD-10-CM

## 2023-08-20 DIAGNOSIS — R1319 Other dysphagia: Secondary | ICD-10-CM | POA: Diagnosis not present

## 2023-08-20 DIAGNOSIS — R471 Dysarthria and anarthria: Secondary | ICD-10-CM | POA: Diagnosis not present

## 2023-08-20 NOTE — Therapy (Signed)
 OUTPATIENT SPEECH LANGUAGE PATHOLOGY  TREATMENT NOTE   Patient Name: Anne Ford MRN: [id number] DOB:05-Oct-1940, 83 y.o., female Today's Date: 08/20/2023  PCP: Ardell Manly, MD REFERRING PROVIDER: Camie Sevin, PA-C   End of Session - 08/20/23 1622     Visit Number 2    Number of Visits 17    Date for SLP Re-Evaluation 10/14/23    Authorization Type [employer] PPO    Authorization Time Period 08/18/2023 thru 11/16/2023    Authorization - Visit Number 2    Authorization - Number of Visits 16    Progress Note Due on Visit 10    SLP Start Time 1445    SLP Stop Time  1530    SLP Time Calculation (min) 45 min    Activity Tolerance Patient tolerated treatment well          No past medical history on file.  The histories are not reviewed yet. Please review them in the History navigator section and refresh this SmartLink. Patient Active Problem List   Diagnosis Date Noted   Dementia due to Alzheimer's disease (HCC) 02/16/2021   Allergic rhinitis 02/15/2021   Essential hypertension 02/15/2021   Gastro-esophageal reflux disease without esophagitis 02/15/2021   Memory deficit 02/15/2021   Osteochondropathy 02/15/2021   Paresthesia 02/15/2021   Prediabetes 02/15/2021   Pure hypercholesterolemia 02/15/2021    ONSET DATE: Memory deficits documented on 02/15/2021; date of referral 08/05/2023  REFERRING DIAG: G30.9,F02.80 (ICD-10-CM) - Dementia due to Alzheimer's disease (HCC)   THERAPY DIAG:  Cognitive communication deficit  Rationale for Evaluation and Treatment Rehabilitation  SUBJECTIVE:   SUBJECTIVE STATEMENT: Pt pleasant, eager, speech noticeably impaired Pt accompanied by: family member pt's daughter  PERTINENT HISTORY: Pt is a 83 year old female with past medical history of hypertension, hyperlipidemia, vitamin D deficiency, prediabetes, anxiety with a history of dementia likely due to Alzheimer's disease.   Chart review reveals mention of  other amnesia by pt's PCP on 12/13/2020 (MD notes not available on EPIC). Note dated 02/15/2021 from neurology states MoCA today is 15/30, with deficiencies in delayed recall 0/5, abstraction, naming 1/3, and visuospatial 3/5. Patient is on memantine 10 mg twice daily by PCP.  NCV-EMG was negative without evidence of a large fiber sensorimotor polyneuropathy, cervical/lumbosacral radiculopathy, or carpal tunnel syndrome.   MMSE (Mini Mental Status Exam) 03/20/2022  Score: 22 09/23/2022 Score: 22 03/20/2023 Score: 19  DIAGNOSTIC FINDINGS:  Head CT 12/22/2020 Mild generalized cerebral and cerebellar atrophy.  Head CT 02/19/2021 Brain: No evidence of acute infarction, hemorrhage, hydrocephalus, extra-axial collection or mass lesion/mass effect. MRI 03/03/2021 d/t Gait disturbance and speech disturbance Brain: Diffusion imaging does not show any acute or subacute infarction. There is generalized age related volume loss. Chronic small-vessel ischemic changes affect the pons. No focal cerebellar insult. Cerebral hemispheres show mild chronic small-vessel ischemic change. No cortical or large vessel territory infarction. No mass lesion, hemorrhage, hydrocephalus or extra-axial collection. MRI 08/05/2023 1. No acute or reversible finding. Mild chronic small-vessel ischemic change of the cerebral hemispheric white matter, similar to the study of 03/03/2021. 2. Generalized brain atrophy, subjectively slightly progressive since 2023, though there is no subjective lobar predominance.  PAIN:  Are you having pain? No   FALLS: Has patient fallen in last 6 months?  No  LIVING ENVIRONMENT: Lives with: lives with their spouse Lives in: House/apartment  PLOF:  Level of assistance: Needed assistance with ADLs, Needed assistance with IADLS Employment: Retired   PATIENT GOALS   I don't  want to be dependent  OBJECTIVE:    TODAY'S TREATMENT:    STANDARDIZED ASSESSMENTS: Addenbrooke's  Cognitive Examination - ACE III The Addenbrooke's Cognitive Examination-III (ACE-III) is a brief cognitive test that assesses five cognitive domains. The total score is 100 with higher scores indicating better cognitive functioning. Cut off scores of 88 and 82 are recommended for suspicion of dementia (88 has sensitivity of 1.00 and specificity of 0.96, 82 has sensitivity of 0.93 and specificity of 1.00). American Version B  Attention 9/18  Memory 8/26  Fluency 3/14  Language 20/26  Visuospatial 11/16  TOTAL ACE- III Score 51/100     PATIENT REPORTED OUTCOME MEASURES (PROM): To be completed over the next 3 sessions   PATIENT EDUCATION: Education details: results of this evaluation, ST POC Person educated: Patient and Child(ren) Education method: Explanation Education comprehension: needs further education  HOME EXERCISE PROGRAM:        N/A    GOALS:  Goals reviewed with patient? Yes  SHORT TERM GOALS: Target date: 10 sessions  To identify emerging areas of strength/need, patient will participate in ongoing diagnostic treatment (Mini Addenbrooke's Cognitive Examination, paragraph recall, functional reading/writing)  Baseline: Goal status: INITIAL    LONG TERM GOALS: Target date: 10/02/2023  To maximize preparedness for future changes, the patient will describe at least one pro-active strategy designed to prepare for typical changes associated with speech function Baseline:  Goal status: INITIAL  2.  To maximize communication success, the patient will maintain the ability to communicate successfully with familiar and unfamiliar communication partners throughout the progression of the disease  Baseline:  Goal status: INITIAL   ASSESSMENT:  CLINICAL IMPRESSION: Patient is a 83 y.o. right handed female who was seen today for a cognitive communication evaluation d/t memory loss and word finding difficulty secondary to dx of dementia due to Alzheimer's disease. Pt arrives  with her daughter. While pt and her daughter do report memory loss and word finding difficulties, time was spent during this evaluation on her speech disturbance. They report recent progression of speech distrubance over the last 4 months.   During this evaluation, pt presents with profound motor speech impairment that reduced pt's speech intelligiblity is ~ 50%. Her speech disturbance is c/b slow effortful speech, reduced articulation and coordination of articulators, intermittent consonant deletion (des for desk), distorted vowels and difficulty producing multisyllabic words. In addition she also has a strained and breathy vocal quality, reduced pitch and loudness resulting on monotone speech. Lengthy response times (>10 seconds response time to questions and > 5 seconds between words within thought) appear related to motor movements not cognitive retrieval or word finding difficulty.   In addition to the above mentioned speech disturbance, pt presents with severe cognitive impairment c/b severe deficits in memory (impacting orientation, short term memory), language fluency with mild deficits in visuospatial abilities and attention. Pt is aware of errors and her attempts at self-correcting are delayed but effective.     OBJECTIVE IMPAIRMENTS include memory, executive functioning, expressive language, and dysarthria. These impairments are limiting patient from managing medications, managing appointments, household responsibilities, ADLs/IADLs, and effectively communicating at home and in community. Factors affecting potential to achieve goals and functional outcome are medical prognosis and severity of impairments.. Patient will benefit from skilled SLP services to address above impairments and improve overall function.  REHAB POTENTIAL: Fair neurodegenerative disease process, severity of speech disturbance  PLAN: SLP FREQUENCY: 1-2x/week  SLP DURATION: 8 weeks  PLANNED INTERVENTIONS: Language  facilitation, Environmental controls, Cueing hierachy,  Cognitive reorganization, Internal/external aids, Functional tasks, Multimodal communication approach, SLP instruction and feedback, Compensatory strategies, and Patient/family education    Salam Chesterfield B. Rubbie, M.S., CCC-SLP, Tree surgeon Certified Brain Injury Specialist [facility]  [facility] Rehabilitation Services Office [phone] Ascom [phone] Fax [phone]

## 2023-08-25 ENCOUNTER — Ambulatory Visit: Admitting: Speech Pathology

## 2023-08-25 DIAGNOSIS — R471 Dysarthria and anarthria: Secondary | ICD-10-CM

## 2023-08-25 DIAGNOSIS — R1319 Other dysphagia: Secondary | ICD-10-CM | POA: Diagnosis not present

## 2023-08-25 DIAGNOSIS — R41841 Cognitive communication deficit: Secondary | ICD-10-CM | POA: Diagnosis not present

## 2023-08-25 NOTE — Therapy (Signed)
 OUTPATIENT SPEECH LANGUAGE PATHOLOGY  TREATMENT NOTE   Patient Name: Anne Ford MRN: [id number] DOB:12-Dec-1940, 83 y.o., female Today's Date: 08/25/2023  PCP: Ardell Manly, MD REFERRING PROVIDER: Camie Sevin, PA-C   End of Session - 08/25/23 1528     Visit Number 3    Number of Visits 17    Date for SLP Re-Evaluation 10/14/23    Authorization Type [employer] PPO    Authorization Time Period 08/18/2023 thru 11/16/2023    Authorization - Visit Number 3    Authorization - Number of Visits 16    Progress Note Due on Visit 10    SLP Start Time 1530    SLP Stop Time  1615    SLP Time Calculation (min) 45 min    Activity Tolerance Patient tolerated treatment well          No past medical history on file.  The histories are not reviewed yet. Please review them in the History navigator section and refresh this SmartLink. Patient Active Problem List   Diagnosis Date Noted   Dementia due to Alzheimer's disease (HCC) 02/16/2021   Allergic rhinitis 02/15/2021   Essential hypertension 02/15/2021   Gastro-esophageal reflux disease without esophagitis 02/15/2021   Memory deficit 02/15/2021   Osteochondropathy 02/15/2021   Paresthesia 02/15/2021   Prediabetes 02/15/2021   Pure hypercholesterolemia 02/15/2021    ONSET DATE: Memory deficits documented on 02/15/2021; date of referral 08/05/2023  REFERRING DIAG: G30.9,F02.80 (ICD-10-CM) - Dementia due to Alzheimer's disease (HCC)   THERAPY DIAG:  Cognitive communication deficit  Dysarthria and anarthria  Rationale for Evaluation and Treatment Rehabilitation  SUBJECTIVE:   PERTINENT HISTORY: Pt is a 83 year old female with past medical history of hypertension, hyperlipidemia, vitamin D deficiency, prediabetes, anxiety with a history of dementia likely due to Alzheimer's disease.   Chart review reveals mention of other amnesia by pt's PCP on 12/13/2020 (MD notes not available on EPIC). Note dated  02/15/2021 from neurology states MoCA today is 15/30, with deficiencies in delayed recall 0/5, abstraction, naming 1/3, and visuospatial 3/5. Patient is on memantine 10 mg twice daily by PCP.  NCV-EMG was negative without evidence of a large fiber sensorimotor polyneuropathy, cervical/lumbosacral radiculopathy, or carpal tunnel syndrome.   MMSE (Mini Mental Status Exam) 03/20/2022  Score: 22 09/23/2022 Score: 22 03/20/2023 Score: 19  DIAGNOSTIC FINDINGS:  Head CT 12/22/2020 Mild generalized cerebral and cerebellar atrophy.  Head CT 02/19/2021 Brain: No evidence of acute infarction, hemorrhage, hydrocephalus, extra-axial collection or mass lesion/mass effect. MRI 03/03/2021 d/t Gait disturbance and speech disturbance Brain: Diffusion imaging does not show any acute or subacute infarction. There is generalized age related volume loss. Chronic small-vessel ischemic changes affect the pons. No focal cerebellar insult. Cerebral hemispheres show mild chronic small-vessel ischemic change. No cortical or large vessel territory infarction. No mass lesion, hemorrhage, hydrocephalus or extra-axial collection. MRI 08/05/2023 1. No acute or reversible finding. Mild chronic small-vessel ischemic change of the cerebral hemispheric white matter, similar to the study of 03/03/2021. 2. Generalized brain atrophy, subjectively slightly progressive since 2023, though there is no subjective lobar predominance.  PAIN:  Are you having pain? No   FALLS: Has patient fallen in last 6 months?  No  LIVING ENVIRONMENT: Lives with: lives with their spouse Lives in: House/apartment  PLOF:  Level of assistance: Needed assistance with ADLs, Needed assistance with IADLS Employment: Retired   PATIENT GOALS   I don't want to be dependent  SUBJECTIVE STATEMENT: Pt pleasant, eager, speech noticeably  impaired Pt accompanied by: family member pt's daughter  OBJECTIVE:    TODAY'S TREATMENT:      Skilled ST session targeted pt's cognitive communication goals. SLP facilitated session by providing the following interventions:  Skilled verbal and written instructions provided on  listening to favorite hymns and singing along with them throughout the day Looking thru photo albums and having conversation about people, places and times Listening to her Bible while physically reading it  Pt and her daughter confirm completion of advanced directives  Subjective inaccurate recall of information related to recent constipation observed as pt intermittently contradicts herself. Daughter helpful in supplementing information.  Pt observed verbally responding yes but shaking her head to indicate no. Recommend pt supplement answer with gesture (such as thumbs up or thumbs down) but pt reports that she has never talked with my hands. Recommend pt's daughter observe which response is accurate - verbal or head gesture to improve functional communication.   PATIENT EDUCATION: Education details: see the above Person educated: Patient and Child(ren) Education method: Explanation Education comprehension: needs further education  HOME EXERCISE PROGRAM:        As described above    GOALS:  Goals reviewed with patient? Yes  SHORT TERM GOALS: Target date: 10 sessions  To identify emerging areas of strength/need, patient will participate in ongoing diagnostic treatment (Mini Addenbrooke's Cognitive Examination, paragraph recall, functional reading/writing)  Baseline: Goal status: INITIAL    LONG TERM GOALS: Target date: 10/02/2023  To maximize preparedness for future changes, the patient will describe at least one pro-active strategy designed to prepare for typical changes associated with speech function Baseline:  Goal status: INITIAL  2.  To maximize communication success, the patient will maintain the ability to communicate successfully with familiar and unfamiliar communication  partners throughout the progression of the disease  Baseline:  Goal status: INITIAL   ASSESSMENT:  CLINICAL IMPRESSION: Patient is a 83 y.o. right handed female who was seen today for a cognitive communication evaluation d/t memory loss and word finding difficulty secondary to dx of dementia due to Alzheimer's disease. Pt arrives with her daughter. While pt and her daughter do report memory loss and word finding difficulties, time was spent during this evaluation on her speech disturbance. They report recent progression of speech distrubance over the last 4 months.   During this evaluation, pt presents with profound motor speech impairment that reduced pt's speech intelligiblity is ~ 50%. Her speech disturbance is c/b slow effortful speech, reduced articulation and coordination of articulators, intermittent consonant deletion (des for desk), distorted vowels and difficulty producing multisyllabic words. In addition she also has a strained and breathy vocal quality, reduced pitch and loudness resulting on monotone speech. Lengthy response times (>10 seconds response time to questions and > 5 seconds between words within thought) appear related to motor movements not cognitive retrieval or word finding difficulty.   In addition to the above mentioned speech disturbance, pt presents with severe cognitive impairment c/b severe deficits in memory (impacting orientation, short term memory), language fluency with mild deficits in visuospatial abilities and attention. Pt is aware of errors and her attempts at self-correcting are delayed but effective.    Pt eager to engage in therapy activities and recommendations. See the above treatment note for details.    OBJECTIVE IMPAIRMENTS include memory, executive functioning, expressive language, and dysarthria. These impairments are limiting patient from managing medications, managing appointments, household responsibilities, ADLs/IADLs, and effectively  communicating at home and in community. Factors affecting potential to  achieve goals and functional outcome are medical prognosis and severity of impairments.. Patient will benefit from skilled SLP services to address above impairments and improve overall function.  REHAB POTENTIAL: Fair neurodegenerative disease process, severity of speech disturbance  PLAN: SLP FREQUENCY: 1-2x/week  SLP DURATION: 8 weeks  PLANNED INTERVENTIONS: Language facilitation, Environmental controls, Cueing hierachy, Cognitive reorganization, Internal/external aids, Functional tasks, Multimodal communication approach, SLP instruction and feedback, Compensatory strategies, and Patient/family education    Annora Guderian B. Rubbie, M.S., CCC-SLP, Tree surgeon Certified Brain Injury Specialist [facility]  [facility] Rehabilitation Services Office [phone] Ascom [phone] Fax [phone]

## 2023-08-26 ENCOUNTER — Ambulatory Visit: Admitting: Speech Pathology

## 2023-08-26 DIAGNOSIS — R471 Dysarthria and anarthria: Secondary | ICD-10-CM | POA: Diagnosis not present

## 2023-08-26 DIAGNOSIS — R1319 Other dysphagia: Secondary | ICD-10-CM | POA: Diagnosis not present

## 2023-08-26 DIAGNOSIS — R41841 Cognitive communication deficit: Secondary | ICD-10-CM

## 2023-08-26 NOTE — Therapy (Signed)
 OUTPATIENT SPEECH LANGUAGE PATHOLOGY  TREATMENT NOTE   Patient Name: Anne Ford MRN: [id number] DOB:1940/03/13, 83 y.o., female Today's Date: 08/26/2023  PCP: Ardell Manly, MD REFERRING PROVIDER: Camie Sevin, PA-C   End of Session - 08/26/23 1636     Visit Number 4    Number of Visits 17    Date for SLP Re-Evaluation 10/14/23    Authorization Type [employer] PPO    Authorization Time Period 08/18/2023 thru 11/16/2023    Authorization - Visit Number 4    Authorization - Number of Visits 16    Progress Note Due on Visit 10    SLP Start Time 1400    SLP Stop Time  1445    SLP Time Calculation (min) 45 min    Activity Tolerance Patient tolerated treatment well          No past medical history on file.  The histories are not reviewed yet. Please review them in the History navigator section and refresh this SmartLink. Patient Active Problem List   Diagnosis Date Noted   Dementia due to Alzheimer's disease (HCC) 02/16/2021   Allergic rhinitis 02/15/2021   Essential hypertension 02/15/2021   Gastro-esophageal reflux disease without esophagitis 02/15/2021   Memory deficit 02/15/2021   Osteochondropathy 02/15/2021   Paresthesia 02/15/2021   Prediabetes 02/15/2021   Pure hypercholesterolemia 02/15/2021    ONSET DATE: Memory deficits documented on 02/15/2021; date of referral 08/05/2023  REFERRING DIAG: G30.9,F02.80 (ICD-10-CM) - Dementia due to Alzheimer's disease (HCC)   THERAPY DIAG:  Cognitive communication deficit  Rationale for Evaluation and Treatment Rehabilitation  SUBJECTIVE:   PERTINENT HISTORY: Pt is a 83 year old female with past medical history of hypertension, hyperlipidemia, vitamin D deficiency, prediabetes, anxiety with a history of dementia likely due to Alzheimer's disease.   Chart review reveals mention of other amnesia by pt's PCP on 12/13/2020 (MD notes not available on EPIC). Note dated 02/15/2021 from neurology states  MoCA today is 15/30, with deficiencies in delayed recall 0/5, abstraction, naming 1/3, and visuospatial 3/5. Patient is on memantine 10 mg twice daily by PCP.  NCV-EMG was negative without evidence of a large fiber sensorimotor polyneuropathy, cervical/lumbosacral radiculopathy, or carpal tunnel syndrome.   MMSE (Mini Mental Status Exam) 03/20/2022  Score: 22 09/23/2022 Score: 22 03/20/2023 Score: 19  DIAGNOSTIC FINDINGS:  Head CT 12/22/2020 Mild generalized cerebral and cerebellar atrophy.  Head CT 02/19/2021 Brain: No evidence of acute infarction, hemorrhage, hydrocephalus, extra-axial collection or mass lesion/mass effect. MRI 03/03/2021 d/t Gait disturbance and speech disturbance Brain: Diffusion imaging does not show any acute or subacute infarction. There is generalized age related volume loss. Chronic small-vessel ischemic changes affect the pons. No focal cerebellar insult. Cerebral hemispheres show mild chronic small-vessel ischemic change. No cortical or large vessel territory infarction. No mass lesion, hemorrhage, hydrocephalus or extra-axial collection. MRI 08/05/2023 1. No acute or reversible finding. Mild chronic small-vessel ischemic change of the cerebral hemispheric white matter, similar to the study of 03/03/2021. 2. Generalized brain atrophy, subjectively slightly progressive since 2023, though there is no subjective lobar predominance.  PAIN:  Are you having pain? No   FALLS: Has patient fallen in last 6 months?  No  LIVING ENVIRONMENT: Lives with: lives with their spouse Lives in: House/apartment  PLOF:  Level of assistance: Needed assistance with ADLs, Needed assistance with IADLS Employment: Retired   PATIENT GOALS   I don't want to be dependent  SUBJECTIVE STATEMENT: Pt pleasant, eager, speech noticeably impaired with reduce syntax  during today's session Pt accompanied by: family member pt's daughter  OBJECTIVE:    TODAY'S TREATMENT:      Skilled ST session targeted pt's cognitive communication goals. SLP facilitated session by providing the following interventions: Pt continues to be eager. She reports that she and her husband have started looking at photos. She reports that her husband was very engaged when looking at the list of previous recommendations.   Pt's speech was slow, appeared laborsome and more effortful during today's session. As a result, her speech was more halting with greater pauses in between words. Her mean utterance length was shorter, containing less syntax - for example she stated pretty shirt referring to this writer's shirt, phonemic errors present within bi-syllable words as well.   Introduced [employer] fast streamer book to contain names, pictures and written information about her family life. Pt's daughter to sent this Clinical research associate some pictures. Skilled verbal and written instruction for pt to continue participating in creating written list for grocery store and going to the grocery store with her daughter.   PATIENT EDUCATION: Education details: see the above Person educated: Patient and Child(ren) Education method: Explanation Education comprehension: needs further education  HOME EXERCISE PROGRAM:        As described above    GOALS:  Goals reviewed with patient? Yes  SHORT TERM GOALS: Target date: 10 sessions  To identify emerging areas of strength/need, patient will participate in ongoing diagnostic treatment (Mini Addenbrooke's Cognitive Examination, paragraph recall, functional reading/writing)  Baseline: Goal status: INITIAL    LONG TERM GOALS: Target date: 10/02/2023  To maximize preparedness for future changes, the patient will describe at least one pro-active strategy designed to prepare for typical changes associated with speech function Baseline:  Goal status: INITIAL  2.  To maximize communication success, the patient will maintain the ability to communicate successfully with familiar and  unfamiliar communication partners throughout the progression of the disease  Baseline:  Goal status: INITIAL   ASSESSMENT:  CLINICAL IMPRESSION: Patient is a 83 y.o. right handed female who was seen today for a cognitive communication evaluation d/t memory loss and word finding difficulty secondary to dx of dementia due to Alzheimer's disease. Pt arrives with her daughter. While pt and her daughter do report memory loss and word finding difficulties, time was spent during this evaluation on her speech disturbance. They report recent progression of speech distrubance over the last 4 months.   During this evaluation, pt presents with profound motor speech impairment that reduced pt's speech intelligiblity is ~ 50%. Her speech disturbance is c/b slow effortful speech, reduced articulation and coordination of articulators, intermittent consonant deletion (des for desk), distorted vowels and difficulty producing multisyllabic words. In addition she also has a strained and breathy vocal quality, reduced pitch and loudness resulting on monotone speech. Lengthy response times (>10 seconds response time to questions and > 5 seconds between words within thought) appear related to motor movements not cognitive retrieval or word finding difficulty.   In addition to the above mentioned speech disturbance, pt presents with severe cognitive impairment c/b severe deficits in memory (impacting orientation, short term memory), language fluency with mild deficits in visuospatial abilities and attention. Pt is aware of errors and her attempts at self-correcting are delayed but effective.    Pt eager to engage in therapy activities and recommendations. See the above treatment note for details.    OBJECTIVE IMPAIRMENTS include memory, executive functioning, expressive language, and dysarthria. These impairments are limiting patient from managing medications, managing appointments,  household responsibilities,  ADLs/IADLs, and effectively communicating at home and in community. Factors affecting potential to achieve goals and functional outcome are medical prognosis and severity of impairments.. Patient will benefit from skilled SLP services to address above impairments and improve overall function.  REHAB POTENTIAL: Fair neurodegenerative disease process, severity of speech disturbance  PLAN: SLP FREQUENCY: 1-2x/week  SLP DURATION: 8 weeks  PLANNED INTERVENTIONS: Language facilitation, Environmental controls, Cueing hierachy, Cognitive reorganization, Internal/external aids, Functional tasks, Multimodal communication approach, SLP instruction and feedback, Compensatory strategies, and Patient/family education    Lenardo Westwood B. Rubbie, M.S., CCC-SLP, Tree surgeon Certified Brain Injury Specialist [facility]  [facility] Rehabilitation Services Office [phone] Ascom [phone] Fax [phone]

## 2023-08-27 ENCOUNTER — Ambulatory Visit: Admitting: Speech Pathology

## 2023-09-01 ENCOUNTER — Ambulatory Visit: Admitting: Speech Pathology

## 2023-09-03 ENCOUNTER — Ambulatory Visit: Admitting: Speech Pathology

## 2023-09-04 ENCOUNTER — Ambulatory Visit: Admitting: Speech Pathology

## 2023-09-04 DIAGNOSIS — R471 Dysarthria and anarthria: Secondary | ICD-10-CM | POA: Diagnosis not present

## 2023-09-04 DIAGNOSIS — R41841 Cognitive communication deficit: Secondary | ICD-10-CM | POA: Diagnosis not present

## 2023-09-04 DIAGNOSIS — R1319 Other dysphagia: Secondary | ICD-10-CM | POA: Diagnosis not present

## 2023-09-04 NOTE — Therapy (Signed)
 OUTPATIENT SPEECH LANGUAGE PATHOLOGY  TREATMENT NOTE   Patient Name: Anne Ford MRN: [id number] DOB:Oct 26, 1940, 83 y.o., female Today's Date: 09/04/2023  PCP: Ardell Manly, MD REFERRING PROVIDER: Camie Sevin, PA-C   End of Session - 09/04/23 1533     Visit Number 5    Number of Visits 17    Date for SLP Re-Evaluation 10/14/23    Authorization Type [employer] PPO    Authorization Time Period 08/18/2023 thru 11/16/2023    Authorization - Visit Number 5    Authorization - Number of Visits 16    Progress Note Due on Visit 10    SLP Start Time 1445    SLP Stop Time  1530    SLP Time Calculation (min) 45 min    Activity Tolerance Patient tolerated treatment well          No past medical history on file.  The histories are not reviewed yet. Please review them in the History navigator section and refresh this SmartLink. Patient Active Problem List   Diagnosis Date Noted   Dementia due to Alzheimer's disease (HCC) 02/16/2021   Allergic rhinitis 02/15/2021   Essential hypertension 02/15/2021   Gastro-esophageal reflux disease without esophagitis 02/15/2021   Memory deficit 02/15/2021   Osteochondropathy 02/15/2021   Paresthesia 02/15/2021   Prediabetes 02/15/2021   Pure hypercholesterolemia 02/15/2021    ONSET DATE: Memory deficits documented on 02/15/2021; date of referral 08/05/2023  REFERRING DIAG: G30.9,F02.80 (ICD-10-CM) - Dementia due to Alzheimer's disease (HCC)   THERAPY DIAG:  Cognitive communication deficit  Rationale for Evaluation and Treatment Rehabilitation  SUBJECTIVE:   PERTINENT HISTORY: Pt is a 83 year old female with past medical history of hypertension, hyperlipidemia, vitamin D deficiency, prediabetes, anxiety with a history of dementia likely due to Alzheimer's disease.   Chart review reveals mention of other amnesia by pt's PCP on 12/13/2020 (MD notes not available on EPIC). Note dated 02/15/2021 from neurology states  MoCA today is 15/30, with deficiencies in delayed recall 0/5, abstraction, naming 1/3, and visuospatial 3/5. Patient is on memantine 10 mg twice daily by PCP.  NCV-EMG was negative without evidence of a large fiber sensorimotor polyneuropathy, cervical/lumbosacral radiculopathy, or carpal tunnel syndrome.   MMSE (Mini Mental Status Exam) 03/20/2022  Score: 22 09/23/2022 Score: 22 03/20/2023 Score: 19  DIAGNOSTIC FINDINGS:  Head CT 12/22/2020 Mild generalized cerebral and cerebellar atrophy.  Head CT 02/19/2021 Brain: No evidence of acute infarction, hemorrhage, hydrocephalus, extra-axial collection or mass lesion/mass effect. MRI 03/03/2021 d/t Gait disturbance and speech disturbance Brain: Diffusion imaging does not show any acute or subacute infarction. There is generalized age related volume loss. Chronic small-vessel ischemic changes affect the pons. No focal cerebellar insult. Cerebral hemispheres show mild chronic small-vessel ischemic change. No cortical or large vessel territory infarction. No mass lesion, hemorrhage, hydrocephalus or extra-axial collection. MRI 08/05/2023 1. No acute or reversible finding. Mild chronic small-vessel ischemic change of the cerebral hemispheric white matter, similar to the study of 03/03/2021. 2. Generalized brain atrophy, subjectively slightly progressive since 2023, though there is no subjective lobar predominance.  PAIN:  Are you having pain? No   FALLS: Has patient fallen in last 6 months?  No  LIVING ENVIRONMENT: Lives with: lives with their spouse Lives in: House/apartment  PLOF:  Level of assistance: Needed assistance with ADLs, Needed assistance with IADLS Employment: Retired   PATIENT GOALS   I don't want to be dependent  SUBJECTIVE STATEMENT: Pt pleasant, eager, speech noticeably impaired, decreased recall of  family member's names, able to recognize them in pictures but unable to recall names of grandchildren and  great grands Pt accompanied by: family member pt's daughter  OBJECTIVE:    TODAY'S TREATMENT:    Skilled ST session targeted pt's cognitive communication goals. SLP facilitated session by providing the following interventions:   SLP utilized pictures of pt's family members ot promote recall in creating a memory book. With maximal cues, pt able to recall names of 2 out of 3 grand children and 2 out of 2 great grands. Pt able to read basic sentences detailing biographical information about each family member.   Education provided on use of memory book and reading to promote syntax and recall.   PATIENT EDUCATION: Education details: see the above Person educated: Patient and Child(ren) Education method: Explanation Education comprehension: needs further education  HOME EXERCISE PROGRAM:        As described above    GOALS:  Goals reviewed with patient? Yes  SHORT TERM GOALS: Target date: 10 sessions  To identify emerging areas of strength/need, patient will participate in ongoing diagnostic treatment (Mini Addenbrooke's Cognitive Examination, paragraph recall, functional reading/writing)  Baseline: Goal status: INITIAL    LONG TERM GOALS: Target date: 10/02/2023  To maximize preparedness for future changes, the patient will describe at least one pro-active strategy designed to prepare for typical changes associated with speech function Baseline:  Goal status: INITIAL  2.  To maximize communication success, the patient will maintain the ability to communicate successfully with familiar and unfamiliar communication partners throughout the progression of the disease  Baseline:  Goal status: INITIAL   ASSESSMENT:  CLINICAL IMPRESSION: Patient is a 83 y.o. right handed female who was seen today for a cognitive communication evaluation d/t memory loss and word finding difficulty secondary to dx of dementia due to Alzheimer's disease. Pt arrives with her daughter. While pt and  her daughter do report memory loss and word finding difficulties, time was spent during this evaluation on her speech disturbance. They report recent progression of speech distrubance over the last 4 months.   During this evaluation, pt presents with profound motor speech impairment that reduced pt's speech intelligiblity is ~ 50%. Her speech disturbance is c/b slow effortful speech, reduced articulation and coordination of articulators, intermittent consonant deletion (des for desk), distorted vowels and difficulty producing multisyllabic words. In addition she also has a strained and breathy vocal quality, reduced pitch and loudness resulting on monotone speech. Lengthy response times (>10 seconds response time to questions and > 5 seconds between words within thought) appear related to motor movements not cognitive retrieval or word finding difficulty.   In addition to the above mentioned speech disturbance, pt presents with severe cognitive impairment c/b severe deficits in memory (impacting orientation, short term memory), language fluency with mild deficits in visuospatial abilities and attention. Pt is aware of errors and her attempts at self-correcting are delayed but effective.    Pt eager to engage in therapy activities and recommendations. See the above treatment note for details.    OBJECTIVE IMPAIRMENTS include memory, executive functioning, expressive language, and dysarthria. These impairments are limiting patient from managing medications, managing appointments, household responsibilities, ADLs/IADLs, and effectively communicating at home and in community. Factors affecting potential to achieve goals and functional outcome are medical prognosis and severity of impairments.. Patient will benefit from skilled SLP services to address above impairments and improve overall function.  REHAB POTENTIAL: Fair neurodegenerative disease process, severity of speech disturbance  PLAN: SLP  FREQUENCY: 1-2x/week  SLP DURATION: 8 weeks  PLANNED INTERVENTIONS: Language facilitation, Environmental controls, Cueing hierachy, Cognitive reorganization, Internal/external aids, Functional tasks, Multimodal communication approach, SLP instruction and feedback, Compensatory strategies, and Patient/family education    Anne Ford B. Rubbie, M.S., CCC-SLP, Tree surgeon Certified Brain Injury Specialist [facility]  [facility] Rehabilitation Services Office [phone] Ascom [phone] Fax [phone]

## 2023-09-08 ENCOUNTER — Ambulatory Visit: Admitting: Speech Pathology

## 2023-09-08 DIAGNOSIS — R41841 Cognitive communication deficit: Secondary | ICD-10-CM | POA: Diagnosis not present

## 2023-09-08 DIAGNOSIS — R1319 Other dysphagia: Secondary | ICD-10-CM | POA: Diagnosis not present

## 2023-09-08 DIAGNOSIS — R471 Dysarthria and anarthria: Secondary | ICD-10-CM | POA: Diagnosis not present

## 2023-09-08 NOTE — Therapy (Signed)
 OUTPATIENT SPEECH LANGUAGE PATHOLOGY  TREATMENT NOTE   Patient Name: Anne Ford MRN: [id number] DOB:November 21, 1940, 83 y.o., female Today's Date: 09/08/2023  PCP: Ardell Manly, MD REFERRING PROVIDER: Camie Sevin, PA-C   End of Session - 09/08/23 1527     Visit Number 6    Number of Visits 17    Date for SLP Re-Evaluation 10/14/23    Authorization Type [employer] PPO    Authorization Time Period 08/18/2023 thru 11/16/2023    Authorization - Visit Number 6    Authorization - Number of Visits 16    Progress Note Due on Visit 10    SLP Start Time 1530    SLP Stop Time  1615    SLP Time Calculation (min) 45 min    Activity Tolerance Patient tolerated treatment well          No past medical history on file.  The histories are not reviewed yet. Please review them in the History navigator section and refresh this SmartLink. Patient Active Problem List   Diagnosis Date Noted   Dementia due to Alzheimer's disease (HCC) 02/16/2021   Allergic rhinitis 02/15/2021   Essential hypertension 02/15/2021   Gastro-esophageal reflux disease without esophagitis 02/15/2021   Memory deficit 02/15/2021   Osteochondropathy 02/15/2021   Paresthesia 02/15/2021   Prediabetes 02/15/2021   Pure hypercholesterolemia 02/15/2021    ONSET DATE: Memory deficits documented on 02/15/2021; date of referral 08/05/2023  REFERRING DIAG: G30.9,F02.80 (ICD-10-CM) - Dementia due to Alzheimer's disease (HCC)   THERAPY DIAG:  Cognitive communication deficit  Rationale for Evaluation and Treatment Rehabilitation  SUBJECTIVE:   PERTINENT HISTORY: Pt is a 83 year old female with past medical history of hypertension, hyperlipidemia, vitamin D deficiency, prediabetes, anxiety with a history of dementia likely due to Alzheimer's disease.   Chart review reveals mention of other amnesia by pt's PCP on 12/13/2020 (MD notes not available on EPIC). Note dated 02/15/2021 from neurology states  MoCA today is 15/30, with deficiencies in delayed recall 0/5, abstraction, naming 1/3, and visuospatial 3/5. Patient is on memantine 10 mg twice daily by PCP.  NCV-EMG was negative without evidence of a large fiber sensorimotor polyneuropathy, cervical/lumbosacral radiculopathy, or carpal tunnel syndrome.   MMSE (Mini Mental Status Exam) 03/20/2022  Score: 22 09/23/2022 Score: 22 03/20/2023 Score: 19  DIAGNOSTIC FINDINGS:  Head CT 12/22/2020 Mild generalized cerebral and cerebellar atrophy.  Head CT 02/19/2021 Brain: No evidence of acute infarction, hemorrhage, hydrocephalus, extra-axial collection or mass lesion/mass effect. MRI 03/03/2021 d/t Gait disturbance and speech disturbance Brain: Diffusion imaging does not show any acute or subacute infarction. There is generalized age related volume loss. Chronic small-vessel ischemic changes affect the pons. No focal cerebellar insult. Cerebral hemispheres show mild chronic small-vessel ischemic change. No cortical or large vessel territory infarction. No mass lesion, hemorrhage, hydrocephalus or extra-axial collection. MRI 08/05/2023 1. No acute or reversible finding. Mild chronic small-vessel ischemic change of the cerebral hemispheric white matter, similar to the study of 03/03/2021. 2. Generalized brain atrophy, subjectively slightly progressive since 2023, though there is no subjective lobar predominance.  PAIN:  Are you having pain? No   FALLS: Has patient fallen in last 6 months?  No  LIVING ENVIRONMENT: Lives with: lives with their spouse Lives in: House/apartment  PLOF:  Level of assistance: Needed assistance with ADLs, Needed assistance with IADLS Employment: Retired   PATIENT GOALS   I don't want to be dependent  SUBJECTIVE STATEMENT: Pt pleasant, eager, speech noticeably impaired, decreased recall of  family member's names, able to recognize them in pictures but unable to recall names of grandchildren and  great grands Pt accompanied by: family member pt's daughter  OBJECTIVE:    TODAY'S TREATMENT:    Skilled ST session targeted pt's cognitive communication goals. SLP facilitated session by providing the following interventions:   SLP utilized pictures of pt's family members ot promote recall in creating a memory book. With maximal cues, pt able to recall names of 2 out of 3 grand children and 2 out of 2 great grands. Pt able to read basic sentences detailing biographical information about each family member.   Education provided on use of memory book and reading to promote syntax and recall.   PATIENT EDUCATION: Education details: see the above Person educated: Patient and Child(ren) Education method: Explanation Education comprehension: needs further education  HOME EXERCISE PROGRAM:        As described above    GOALS:  Goals reviewed with patient? Yes  SHORT TERM GOALS: Target date: 10 sessions  To identify emerging areas of strength/need, patient will participate in ongoing diagnostic treatment (Mini Addenbrooke's Cognitive Examination, paragraph recall, functional reading/writing)  Baseline: Goal status: INITIAL    LONG TERM GOALS: Target date: 10/02/2023  To maximize preparedness for future changes, the patient will describe at least one pro-active strategy designed to prepare for typical changes associated with speech function Baseline:  Goal status: INITIAL  2.  To maximize communication success, the patient will maintain the ability to communicate successfully with familiar and unfamiliar communication partners throughout the progression of the disease  Baseline:  Goal status: INITIAL   ASSESSMENT:  CLINICAL IMPRESSION: Patient is a 83 y.o. right handed female who was seen today for a cognitive communication evaluation d/t memory loss and word finding difficulty secondary to dx of dementia due to Alzheimer's disease. Pt arrives with her daughter. While pt and  her daughter do report memory loss and word finding difficulties, time was spent during this evaluation on her speech disturbance. They report recent progression of speech distrubance over the last 4 months.   During this evaluation, pt presents with profound motor speech impairment that reduced pt's speech intelligiblity is ~ 50%. Her speech disturbance is c/b slow effortful speech, reduced articulation and coordination of articulators, intermittent consonant deletion (des for desk), distorted vowels and difficulty producing multisyllabic words. In addition she also has a strained and breathy vocal quality, reduced pitch and loudness resulting on monotone speech. Lengthy response times (>10 seconds response time to questions and > 5 seconds between words within thought) appear related to motor movements not cognitive retrieval or word finding difficulty.   In addition to the above mentioned speech disturbance, pt presents with severe cognitive impairment c/b severe deficits in memory (impacting orientation, short term memory), language fluency with mild deficits in visuospatial abilities and attention. Pt is aware of errors and her attempts at self-correcting are delayed but effective.    Pt eager to engage in therapy activities and recommendations. See the above treatment note for details.    OBJECTIVE IMPAIRMENTS include memory, executive functioning, expressive language, and dysarthria. These impairments are limiting patient from managing medications, managing appointments, household responsibilities, ADLs/IADLs, and effectively communicating at home and in community. Factors affecting potential to achieve goals and functional outcome are medical prognosis and severity of impairments.. Patient will benefit from skilled SLP services to address above impairments and improve overall function.  REHAB POTENTIAL: Fair neurodegenerative disease process, severity of speech disturbance  PLAN: SLP  FREQUENCY: 1-2x/week  SLP DURATION: 8 weeks  PLANNED INTERVENTIONS: Language facilitation, Environmental controls, Cueing hierachy, Cognitive reorganization, Internal/external aids, Functional tasks, Multimodal communication approach, SLP instruction and feedback, Compensatory strategies, and Patient/family education    Malissia Rabbani B. Rubbie, M.S., CCC-SLP, Tree surgeon Certified Brain Injury Specialist [facility]  [facility] Rehabilitation Services Office [phone] Ascom [phone] Fax [phone]

## 2023-09-09 ENCOUNTER — Other Ambulatory Visit: Payer: Self-pay | Admitting: Internal Medicine

## 2023-09-09 DIAGNOSIS — Z1231 Encounter for screening mammogram for malignant neoplasm of breast: Secondary | ICD-10-CM

## 2023-09-10 ENCOUNTER — Ambulatory Visit: Admitting: Speech Pathology

## 2023-09-10 DIAGNOSIS — R41841 Cognitive communication deficit: Secondary | ICD-10-CM | POA: Diagnosis not present

## 2023-09-10 DIAGNOSIS — R1319 Other dysphagia: Secondary | ICD-10-CM | POA: Diagnosis not present

## 2023-09-10 DIAGNOSIS — R471 Dysarthria and anarthria: Secondary | ICD-10-CM | POA: Diagnosis not present

## 2023-09-10 NOTE — Therapy (Unsigned)
 OUTPATIENT SPEECH LANGUAGE PATHOLOGY  TREATMENT NOTE   Patient Name: Anne Ford MRN: [id number] DOB:06/18/1940, 83 y.o., female Today's Date: 09/10/2023  PCP: Ardell Manly, MD REFERRING PROVIDER: Camie Sevin, PA-C   End of Session - 09/10/23 1530     Visit Number 7    Number of Visits 17    Date for SLP Re-Evaluation 10/14/23    Authorization Type [employer] PPO    Authorization Time Period 08/18/2023 thru 11/16/2023    Authorization - Visit Number 7    Authorization - Number of Visits 16    Progress Note Due on Visit 10    SLP Start Time 1530    SLP Stop Time  1605    SLP Time Calculation (min) 35 min    Activity Tolerance Patient tolerated treatment well          No past medical history on file.  The histories are not reviewed yet. Please review them in the History navigator section and refresh this SmartLink. Patient Active Problem List   Diagnosis Date Noted   Dementia due to Alzheimer's disease (HCC) 02/16/2021   Allergic rhinitis 02/15/2021   Essential hypertension 02/15/2021   Gastro-esophageal reflux disease without esophagitis 02/15/2021   Memory deficit 02/15/2021   Osteochondropathy 02/15/2021   Paresthesia 02/15/2021   Prediabetes 02/15/2021   Pure hypercholesterolemia 02/15/2021    ONSET DATE: Memory deficits documented on 02/15/2021; date of referral 08/05/2023  REFERRING DIAG: G30.9,F02.80 (ICD-10-CM) - Dementia due to Alzheimer's disease (HCC)   THERAPY DIAG:  Cognitive communication deficit  Rationale for Evaluation and Treatment Rehabilitation  SUBJECTIVE:   PERTINENT HISTORY: Pt is a 83 year old female with past medical history of hypertension, hyperlipidemia, vitamin D deficiency, prediabetes, anxiety with a history of dementia likely due to Alzheimer's disease.   Chart review reveals mention of other amnesia by pt's PCP on 12/13/2020 (MD notes not available on EPIC). Note dated 02/15/2021 from neurology states  MoCA today is 15/30, with deficiencies in delayed recall 0/5, abstraction, naming 1/3, and visuospatial 3/5. Patient is on memantine 10 mg twice daily by PCP.  NCV-EMG was negative without evidence of a large fiber sensorimotor polyneuropathy, cervical/lumbosacral radiculopathy, or carpal tunnel syndrome.   MMSE (Mini Mental Status Exam) 03/20/2022  Score: 22 09/23/2022 Score: 22 03/20/2023 Score: 19  DIAGNOSTIC FINDINGS:  Head CT 12/22/2020 Mild generalized cerebral and cerebellar atrophy.  Head CT 02/19/2021 Brain: No evidence of acute infarction, hemorrhage, hydrocephalus, extra-axial collection or mass lesion/mass effect. MRI 03/03/2021 d/t Gait disturbance and speech disturbance Brain: Diffusion imaging does not show any acute or subacute infarction. There is generalized age related volume loss. Chronic small-vessel ischemic changes affect the pons. No focal cerebellar insult. Cerebral hemispheres show mild chronic small-vessel ischemic change. No cortical or large vessel territory infarction. No mass lesion, hemorrhage, hydrocephalus or extra-axial collection. MRI 08/05/2023 1. No acute or reversible finding. Mild chronic small-vessel ischemic change of the cerebral hemispheric white matter, similar to the study of 03/03/2021. 2. Generalized brain atrophy, subjectively slightly progressive since 2023, though there is no subjective lobar predominance.  PAIN:  Are you having pain? No   FALLS: Has patient fallen in last 6 months?  No  LIVING ENVIRONMENT: Lives with: lives with their spouse Lives in: House/apartment  PLOF:  Level of assistance: Needed assistance with ADLs, Needed assistance with IADLS Employment: Retired   PATIENT GOALS   I don't want to be dependent  SUBJECTIVE STATEMENT: Pt pleasant, eager, speech continues to be appear laborsome,  pt more emotional during today's session Pt accompanied by: family member pt's daughter  OBJECTIVE:    TODAY'S  TREATMENT:    Skilled ST session targeted pt's cognitive communication goals. SLP facilitated session by providing the following interventions:    Pt brought in her memory pages within a 3 ring binder as well as all of ST educational information. Pt reports discussing Life Alert with her husband. They are not going to get at this time. Continue to recommend pt develop habit of keeping cell phone in her pocket and practice calling her daughter everyday (pressing the number for direct dial).     Lost of dementia support groups provided to pt and her daughter. Education also provided on caregiver support such as providing repetitive information vs asking questions with pt recalling inaccurate information. Questions answered to pt and daughter's satisfaction.   PATIENT EDUCATION: Education details: see the above Person educated: Patient and Child(ren) Education method: Explanation Education comprehension: needs further education  HOME EXERCISE PROGRAM:        As described above    GOALS:  Goals reviewed with patient? Yes  SHORT TERM GOALS: Target date: 10 sessions  To identify emerging areas of strength/need, patient will participate in ongoing diagnostic treatment (Mini Addenbrooke's Cognitive Examination, paragraph recall, functional reading/writing)  Baseline: Goal status: INITIAL    LONG TERM GOALS: Target date: 10/02/2023  To maximize preparedness for future changes, the patient will describe at least one pro-active strategy designed to prepare for typical changes associated with speech function Baseline:  Goal status: INITIAL  2.  To maximize communication success, the patient will maintain the ability to communicate successfully with familiar and unfamiliar communication partners throughout the progression of the disease  Baseline:  Goal status: INITIAL   ASSESSMENT:  CLINICAL IMPRESSION: Patient is a 83 y.o. right handed female who was seen today for a cognitive  communication evaluation d/t memory loss and word finding difficulty secondary to dx of dementia due to Alzheimer's disease. Pt arrives with her daughter. While pt and her daughter do report memory loss and word finding difficulties, time was spent during this evaluation on her speech disturbance. They report recent progression of speech distrubance over the last 4 months.   During this evaluation, pt presents with profound motor speech impairment that reduced pt's speech intelligiblity is ~ 50%. Her speech disturbance is c/b slow effortful speech, reduced articulation and coordination of articulators, intermittent consonant deletion (des for desk), distorted vowels and difficulty producing multisyllabic words. In addition she also has a strained and breathy vocal quality, reduced pitch and loudness resulting on monotone speech. Lengthy response times (>10 seconds response time to questions and > 5 seconds between words within thought) appear related to motor movements not cognitive retrieval or word finding difficulty.   In addition to the above mentioned speech disturbance, pt presents with severe cognitive impairment c/b severe deficits in memory (impacting orientation, short term memory), language fluency with mild deficits in visuospatial abilities and attention. Pt is aware of errors and her attempts at self-correcting are delayed but effective.    Pt eager to engage in therapy activities and recommendations. See the above treatment note for details. Will plan to see pt for a couple additional session to finish education.    OBJECTIVE IMPAIRMENTS include memory, executive functioning, expressive language, and dysarthria. These impairments are limiting patient from managing medications, managing appointments, household responsibilities, ADLs/IADLs, and effectively communicating at home and in community. Factors affecting potential to achieve goals and functional outcome  are medical prognosis and  severity of impairments.. Patient will benefit from skilled SLP services to address above impairments and improve overall function.  REHAB POTENTIAL: Fair neurodegenerative disease process, severity of speech disturbance  PLAN: SLP FREQUENCY: 1-2x/week  SLP DURATION: 8 weeks  PLANNED INTERVENTIONS: Language facilitation, Environmental controls, Cueing hierachy, Cognitive reorganization, Internal/external aids, Functional tasks, Multimodal communication approach, SLP instruction and feedback, Compensatory strategies, and Patient/family education    Happi B. Rubbie, M.S., CCC-SLP, Tree surgeon Certified Brain Injury Specialist [facility]  [facility] Rehabilitation Services Office [phone] Ascom [phone] Fax [phone]

## 2023-09-17 ENCOUNTER — Ambulatory Visit: Attending: Physician Assistant | Admitting: Speech Pathology

## 2023-09-17 DIAGNOSIS — R41841 Cognitive communication deficit: Secondary | ICD-10-CM | POA: Insufficient documentation

## 2023-09-17 NOTE — Therapy (Unsigned)
 OUTPATIENT SPEECH LANGUAGE PATHOLOGY  TREATMENT NOTE DISCHARGE SUMMARY   Patient Name: Anne Ford MRN: [id number] DOB:12/28/1940, 83 y.o., female Today's Date: 09/17/2023  PCP: Ardell Manly, MD REFERRING PROVIDER: Camie Sevin, PA-C   End of Session - 09/17/23 1530     Visit Number 8    Number of Visits 17    Date for SLP Re-Evaluation 10/14/23    Authorization Type [employer] PPO    Authorization Time Period 08/18/2023 thru 11/16/2023    Authorization - Visit Number 8    Authorization - Number of Visits 16    Progress Note Due on Visit 10    SLP Start Time 1530    Activity Tolerance Patient tolerated treatment well          No past medical history on file.  The histories are not reviewed yet. Please review them in the History navigator section and refresh this SmartLink. Patient Active Problem List   Diagnosis Date Noted   Dementia due to Alzheimer's disease (HCC) 02/16/2021   Allergic rhinitis 02/15/2021   Essential hypertension 02/15/2021   Gastro-esophageal reflux disease without esophagitis 02/15/2021   Memory deficit 02/15/2021   Osteochondropathy 02/15/2021   Paresthesia 02/15/2021   Prediabetes 02/15/2021   Pure hypercholesterolemia 02/15/2021    ONSET DATE: Memory deficits documented on 02/15/2021; date of referral 08/05/2023  REFERRING DIAG: G30.9,F02.80 (ICD-10-CM) - Dementia due to Alzheimer's disease (HCC)   THERAPY DIAG:  Cognitive communication deficit  Rationale for Evaluation and Treatment Rehabilitation  SUBJECTIVE:   PERTINENT HISTORY: Pt is a 83 year old female with past medical history of hypertension, hyperlipidemia, vitamin D deficiency, prediabetes, anxiety with a history of dementia likely due to Alzheimer's disease.   Chart review reveals mention of other amnesia by pt's PCP on 12/13/2020 (MD notes not available on EPIC). Note dated 02/15/2021 from neurology states MoCA today is 15/30, with deficiencies in  delayed recall 0/5, abstraction, naming 1/3, and visuospatial 3/5. Patient is on memantine 10 mg twice daily by PCP.  NCV-EMG was negative without evidence of a large fiber sensorimotor polyneuropathy, cervical/lumbosacral radiculopathy, or carpal tunnel syndrome.   MMSE (Mini Mental Status Exam) 03/20/2022  Score: 22 09/23/2022 Score: 22 03/20/2023 Score: 19  DIAGNOSTIC FINDINGS:  Head CT 12/22/2020 Mild generalized cerebral and cerebellar atrophy.  Head CT 02/19/2021 Brain: No evidence of acute infarction, hemorrhage, hydrocephalus, extra-axial collection or mass lesion/mass effect. MRI 03/03/2021 d/t Gait disturbance and speech disturbance Brain: Diffusion imaging does not show any acute or subacute infarction. There is generalized age related volume loss. Chronic small-vessel ischemic changes affect the pons. No focal cerebellar insult. Cerebral hemispheres show mild chronic small-vessel ischemic change. No cortical or large vessel territory infarction. No mass lesion, hemorrhage, hydrocephalus or extra-axial collection. MRI 08/05/2023 1. No acute or reversible finding. Mild chronic small-vessel ischemic change of the cerebral hemispheric white matter, similar to the study of 03/03/2021. 2. Generalized brain atrophy, subjectively slightly progressive since 2023, though there is no subjective lobar predominance.  PAIN:  Are you having pain? No   FALLS: Has patient fallen in last 6 months?  No  LIVING ENVIRONMENT: Lives with: lives with their spouse Lives in: House/apartment  PLOF:  Level of assistance: Needed assistance with ADLs, Needed assistance with IADLS Employment: Retired   PATIENT GOALS   I don't want to be dependent  SUBJECTIVE STATEMENT: Pt pleasant, eager, speech continues to be appear laborsome, Pt accompanied by: family member pt's daughter  OBJECTIVE:    TODAY'S TREATMENT:  Skilled ST session targeted pt's cognitive communication goals. SLP  facilitated session by providing the following interventions:  Pt arrived to session reporting that she had not read thru her memory book as often as recommended. During this session, education was completed on establishing a routine/schedule, delineating night vs day, use of music, physical exercise, creating habit of having her cell phone on self, practicing use of cell phone to call her daughter (to create habit of locating numbers on phone). Completed education with caregiver (daughter) on promoting accurate recall of information thru information sharing.   PATIENT EDUCATION: Education details: see the above Person educated: Patient and Child(ren) Education method: Explanation Education comprehension: needs further education  HOME EXERCISE PROGRAM:        As described above    GOALS:  Goals reviewed with patient? Yes  SHORT TERM GOALS: Target date: 10 sessions   Updated:  09/17/2023 To identify emerging areas of strength/need, patient will participate in ongoing diagnostic treatment (Mini Addenbrooke's Cognitive Examination, paragraph recall, functional reading/writing)  Baseline: Goal status: INITIAL: MET    LONG TERM GOALS: Target date: 10/02/2023 Updated: 09/17/2023 To maximize preparedness for future changes, the patient will describe at least one pro-active strategy designed to prepare for typical changes associated with speech function Baseline:  Goal status: INITIAL: MET  2.  To maximize communication success, the patient will maintain the ability to communicate successfully with familiar and unfamiliar communication partners throughout the progression of the disease  Baseline:  Goal status: INITIAL: MET   ASSESSMENT:  CLINICAL IMPRESSION: Patient is a 83 y.o. right handed female who has been eager to participate in skilled ST sessions. As a result, she and her family have implemented activities to promote cognitive function, maintain functional independence and  increase pt's safety. Although pt continues with profound motor speech impairment, memory loss and difficulty maintaining attention to task, her family is supportive. At this time, maximal benefit from services have been met with pt in agreement with discharge.     OBJECTIVE IMPAIRMENTS include memory, executive functioning, expressive language, and dysarthria. These impairments are limiting patient from managing medications, managing appointments, household responsibilities, ADLs/IADLs, and effectively communicating at home and in community. Factors affecting potential to achieve goals and functional outcome are medical prognosis and severity of impairments.. Patient will benefit from skilled SLP services to address above impairments and improve overall function.  REHAB POTENTIAL: Fair neurodegenerative disease process, severity of speech disturbance  PLAN:  Pt has met maximal benefit from services.   Happi B. Rubbie, M.S., CCC-SLP, Tree surgeon Certified Brain Injury Specialist [facility]  [facility] Rehabilitation Services Office [phone] Ascom [phone] Fax [phone]

## 2023-09-18 ENCOUNTER — Ambulatory Visit: Admitting: Physician Assistant

## 2023-09-18 DIAGNOSIS — J309 Allergic rhinitis, unspecified: Secondary | ICD-10-CM | POA: Diagnosis not present

## 2023-09-18 DIAGNOSIS — R7303 Prediabetes: Secondary | ICD-10-CM | POA: Diagnosis not present

## 2023-09-18 DIAGNOSIS — I10 Essential (primary) hypertension: Secondary | ICD-10-CM | POA: Diagnosis not present

## 2023-09-18 DIAGNOSIS — E7800 Pure hypercholesterolemia, unspecified: Secondary | ICD-10-CM | POA: Diagnosis not present

## 2023-09-18 DIAGNOSIS — G309 Alzheimer's disease, unspecified: Secondary | ICD-10-CM | POA: Diagnosis not present

## 2023-09-18 DIAGNOSIS — N1831 Chronic kidney disease, stage 3a: Secondary | ICD-10-CM | POA: Diagnosis not present

## 2023-09-22 ENCOUNTER — Ambulatory Visit: Admitting: Speech Pathology

## 2023-09-24 ENCOUNTER — Ambulatory Visit: Admitting: Speech Pathology

## 2023-09-29 ENCOUNTER — Ambulatory Visit: Admitting: Speech Pathology

## 2023-10-01 ENCOUNTER — Ambulatory Visit: Admitting: Speech Pathology

## 2023-10-06 ENCOUNTER — Ambulatory Visit: Admitting: Speech Pathology

## 2023-10-08 ENCOUNTER — Ambulatory Visit: Admitting: Speech Pathology

## 2023-10-13 ENCOUNTER — Ambulatory Visit: Admitting: Speech Pathology

## 2023-10-14 ENCOUNTER — Ambulatory Visit
Admission: RE | Admit: 2023-10-14 | Discharge: 2023-10-14 | Disposition: A | Discharge status: Home / Self Care | Source: Ambulatory Visit | Attending: Internal Medicine | Admitting: Internal Medicine

## 2023-10-14 DIAGNOSIS — Z1231 Encounter for screening mammogram for malignant neoplasm of breast: Secondary | ICD-10-CM

## 2023-10-15 ENCOUNTER — Ambulatory Visit: Admitting: Speech Pathology

## 2023-10-20 ENCOUNTER — Ambulatory Visit: Admitting: Speech Pathology

## 2023-10-22 ENCOUNTER — Ambulatory Visit: Admitting: Speech Pathology

## 2023-10-27 ENCOUNTER — Ambulatory Visit: Admitting: Speech Pathology

## 2023-10-29 ENCOUNTER — Ambulatory Visit: Admitting: Speech Pathology

## 2023-11-17 DIAGNOSIS — H26493 Other secondary cataract, bilateral: Secondary | ICD-10-CM | POA: Diagnosis not present

## 2023-11-17 DIAGNOSIS — H353131 Nonexudative age-related macular degeneration, bilateral, early dry stage: Secondary | ICD-10-CM | POA: Diagnosis not present

## 2023-11-17 IMAGING — CT CT HEAD W/O CM
4 series · 16 of 47 positions shown, 18 images · non-contrast
Comparison: December 22, 2020.

CLINICAL DATA: Headache.  Paresthesias.



[Series 3: head bone · axial · 0.40mm/px · z∈[+140,+170]mm · 3 of 75 slices shown]
[im 8/75  bone]
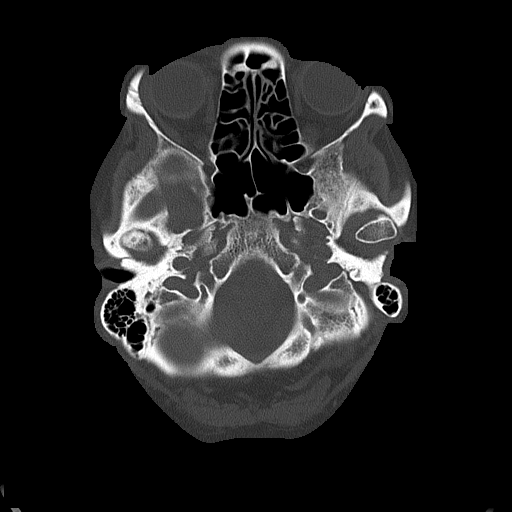
[im 15/75  bone]
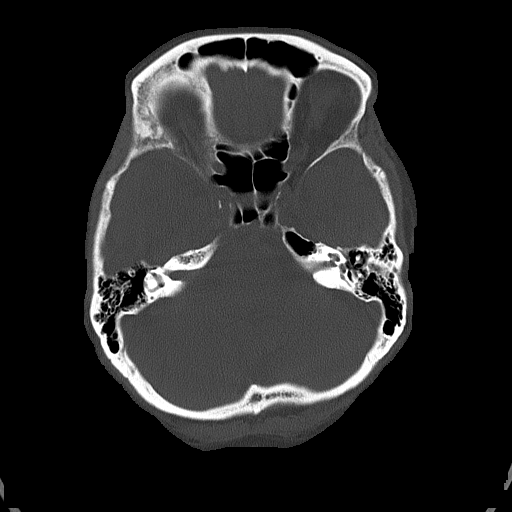
[im 23/75  bone]
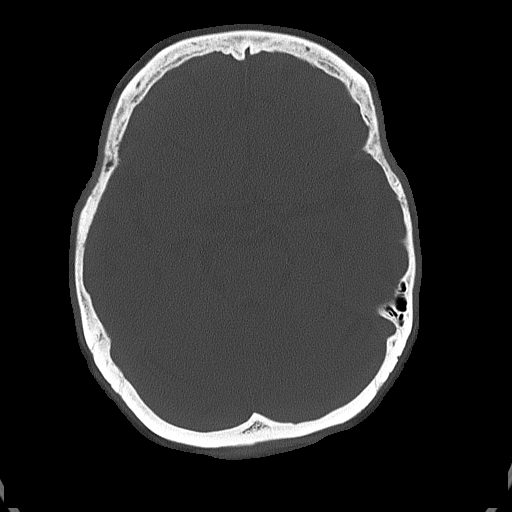

[Series 4: head without · axial · non-contrast · 0.40mm/px · z∈[+141,+251]mm · 7 of 30 slices shown, 9 images]
[im 4/30  brain]
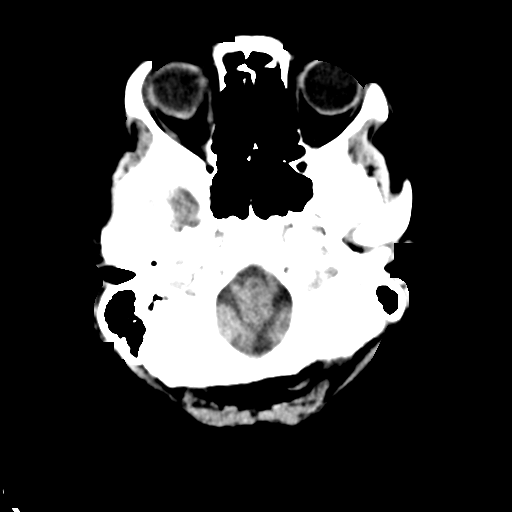
[im 4/30  bone]
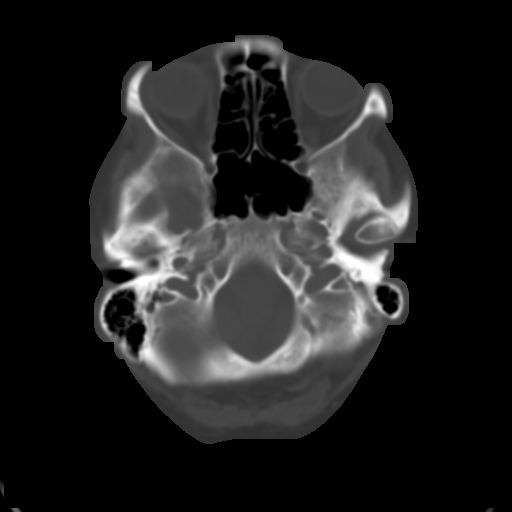
[im 8/30  brain]
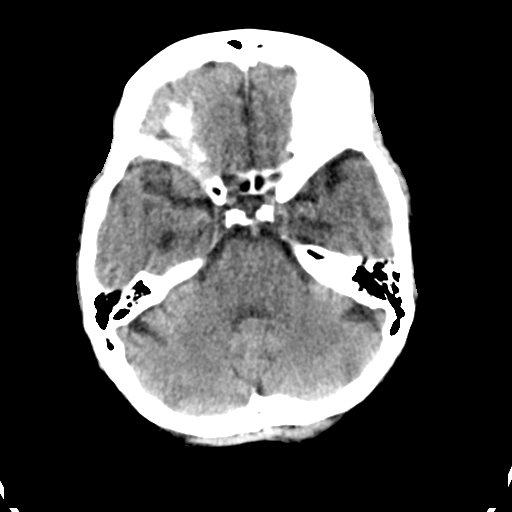
[im 11/30  brain]
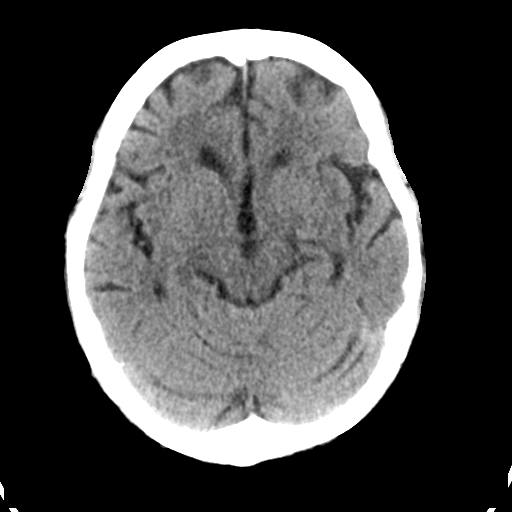
[im 15/30  brain]
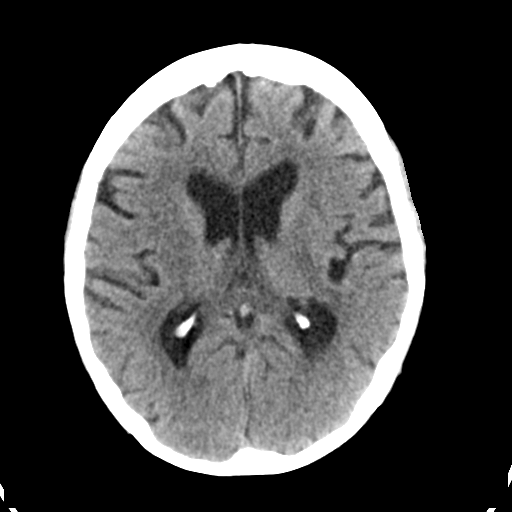
[im 19/30  brain]
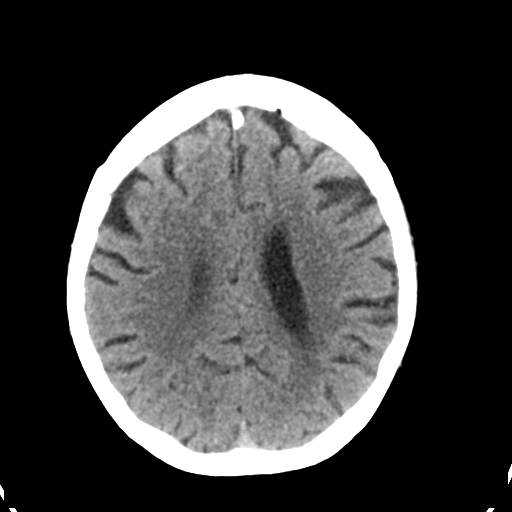
[im 19/30  bone]
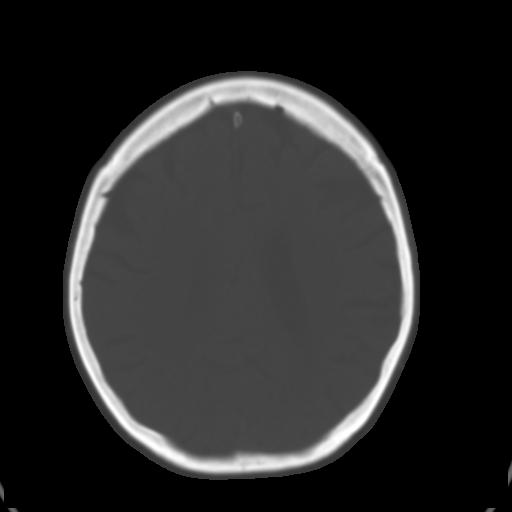
[im 22/30  brain]
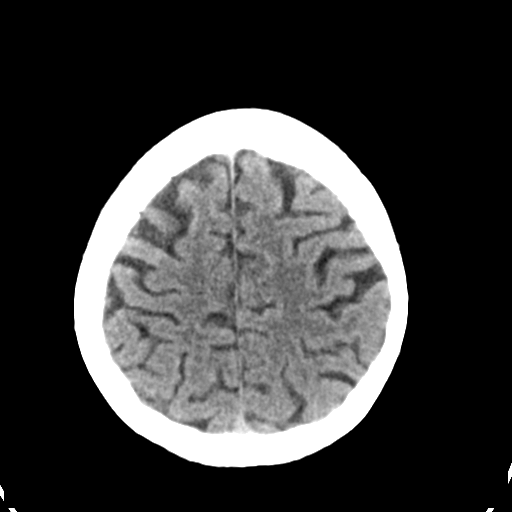
[im 26/30  brain]
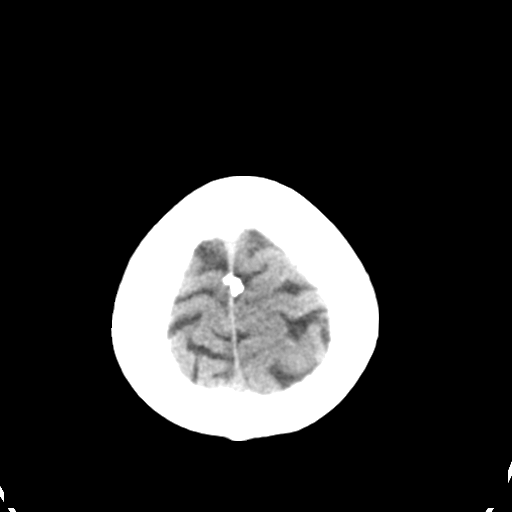

[Series 5: head without cor · coronal · non-contrast · 0.32mm/px · 3 of 62 slices shown]
[im 21/62  brain]
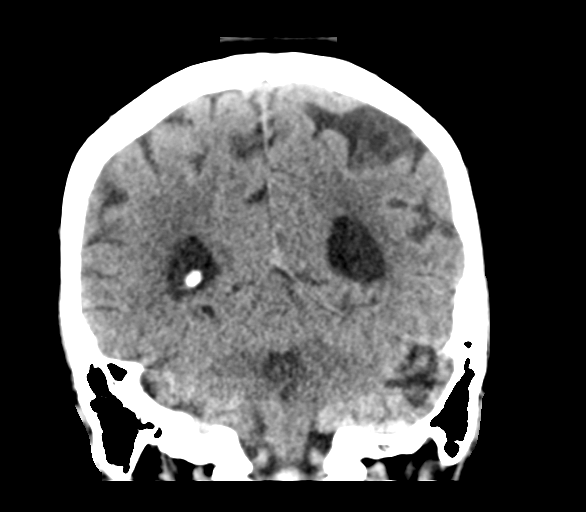
[im 28/62  brain]
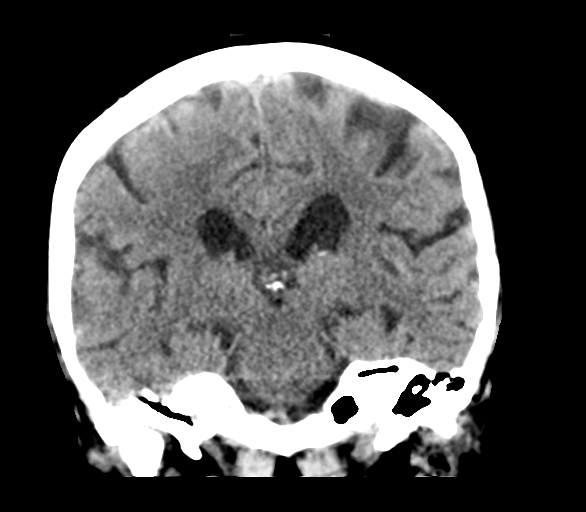
[im 34/62  brain]
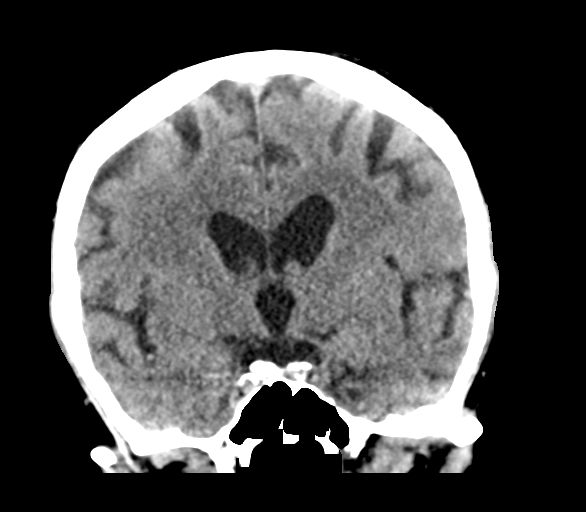

[Series 6: head without sag · sagittal · non-contrast · 0.31mm/px · 3 of 52 slices shown]
[im 18/52  brain]
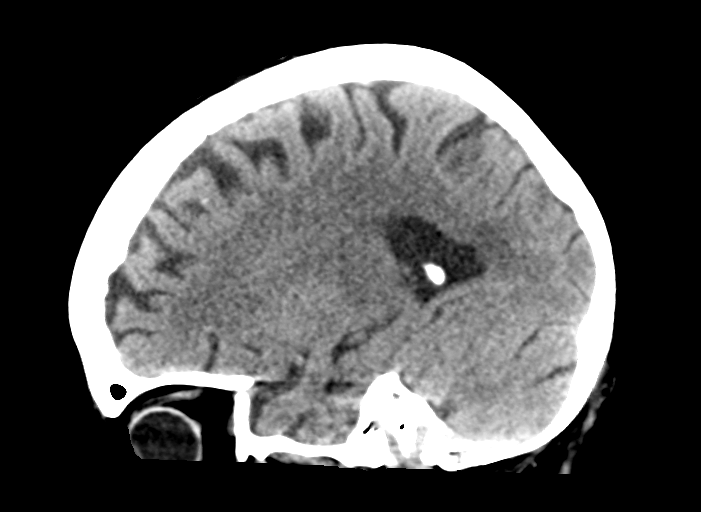
[im 26/52  brain]
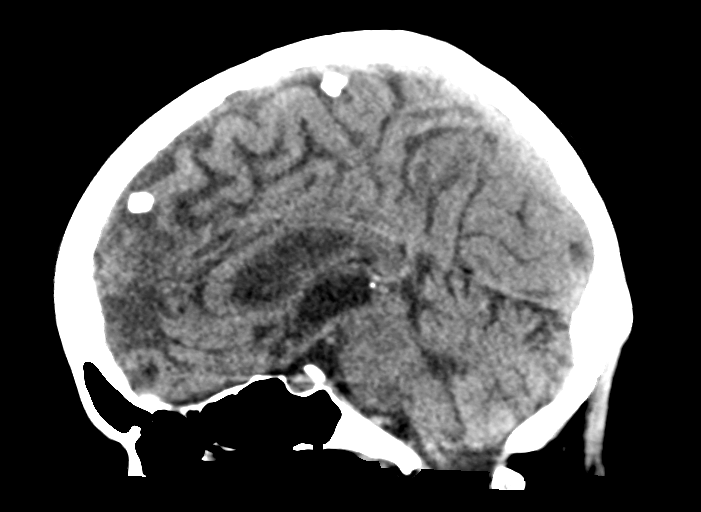
[im 35/52  brain]
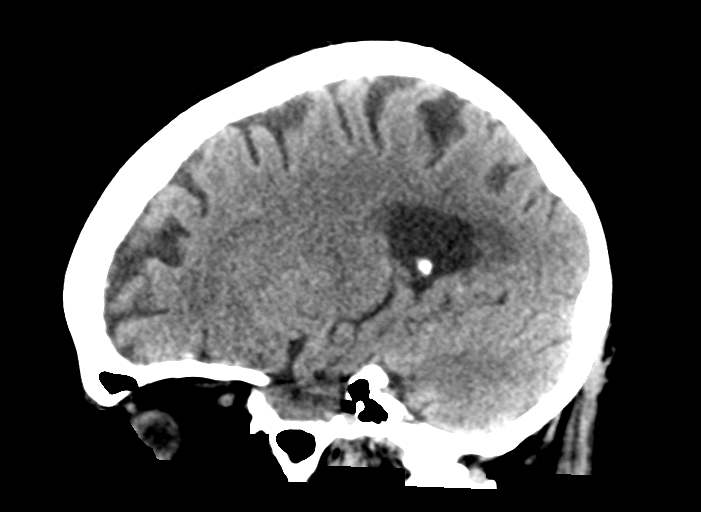

[16 of 47 positions shown; findings below may reference images not displayed]

FINDINGS: Brain: No evidence of acute infarction, hemorrhage, hydrocephalus,
extra-axial collection or mass lesion/mass effect.

Vascular: No hyperdense vessel or unexpected calcification.

Skull: Normal. Negative for fracture or focal lesion.

Sinuses/Orbits: No acute finding.

Other: None.
IMPRESSION: No acute intracranial abnormality seen.

## 2023-12-04 DIAGNOSIS — D2272 Melanocytic nevi of left lower limb, including hip: Secondary | ICD-10-CM | POA: Diagnosis not present

## 2023-12-04 DIAGNOSIS — L821 Other seborrheic keratosis: Secondary | ICD-10-CM | POA: Diagnosis not present

## 2023-12-04 DIAGNOSIS — D2271 Melanocytic nevi of right lower limb, including hip: Secondary | ICD-10-CM | POA: Diagnosis not present

## 2023-12-04 DIAGNOSIS — D225 Melanocytic nevi of trunk: Secondary | ICD-10-CM | POA: Diagnosis not present

## 2023-12-04 DIAGNOSIS — D2262 Melanocytic nevi of left upper limb, including shoulder: Secondary | ICD-10-CM | POA: Diagnosis not present

## 2023-12-04 DIAGNOSIS — R202 Paresthesia of skin: Secondary | ICD-10-CM | POA: Diagnosis not present

## 2023-12-04 DIAGNOSIS — D2261 Melanocytic nevi of right upper limb, including shoulder: Secondary | ICD-10-CM | POA: Diagnosis not present

## 2024-02-04 NOTE — Progress Notes (Signed)
 "   Dementia due to Alzheimer's disease   Anne Ford is a very pleasant 84 y.o. RH female with a history of hypertension, hyperlipidemia, vitamin D deficiency, prediabetes, paresthesias with negative workup for neuropathy, and a history of dementia likely due to Alzheimer's disease seen today in follow up for memory loss. Patient is currently on memantine 10 mg twice daily this is unable to tolerate donepezil ).   Patient was last seen on 08/05/2023.  Mild memory decline is noted, with today's MMSE 18/30.  She is still able to participate down her ADLs to her ability, does not drive.  Discussed with her daughter, who is here today in the office with her supplementing history trying rivastigmine but after discussing the side effects, daughter and patient politely decline adding any other antidementia medication.  Speech is limited, difficulty retrieving words, discussed referral to ST but in the past, she attended sessions but failed to be beneficial to her so she declines today any further therapies.  Mood is good  .  Previous records as well as any outside records available were reviewed prior to todays visit.   Follow up in 6 months Continue memantine 10 mg twice daily, side effects discussed Recommend good control of cardiovascular risk factors.   Continue to control mood as per PCP   Discussed the use of AI scribe software for clinical note transcription with the patient, who gave verbal consent to proceed.  History of Present Illness Anne Ford is an 84 year old female who presents for a follow-up for memory loss  She may be experiencing more difficulty recalling recent events, needing a calendar for assistance with appointments. Her daughter notes variability in her memory, with good and bad days, and occasional difficulty remembering names and appointments. She sometimes remembers conversations from a week ago but struggles with short-term memory, such as recalling what she had for  lunch. Long-term memory is fair, and she occasionally repeats questions about appointments.  She has previously undergone speech therapy, which was not effective in improving her memory or speech.  She could not remember what she learned -daughter says.  Her writing ability has declined, with variability in legibility. Singing familiar songs is easier for her than reading or speaking new words.  She experiences some disorientation at home but recognizes her neighborhood and house. Mood-wise, she can become agitated when unable to express herself quickly, leading to frustration. No hallucinations or paranoia are reported. She remains independent in daily activities but occasionally requires assistance with dressing, particularly with buttons or zippers.  Her sleep is reported as good, with no nightmares or vivid dreams. She manages her own medications with a system in place to avoid missing doses, although she occasionally misses a morning pill. Her husband handles financial responsibilities.  She has a good appetite, drinks plenty of water, and has no issues with swallowing. She cooks with supervision and has not had any recent kitchen accidents. She uses a cane for stability when walking outside and has not experienced recent falls or head injuries. She has chronic numbness and tingling in her feet, but no diagnosed neuropathy. There are no tremors or major pain complaints.  She has stress and urge incontinence, managed with pads.  Denies any constipation or diarrhea   Initial visit 02/15/21  The patient is seen in neurologic consultation at the request of Husain, Karrar, MD for the evaluation of memory.  The patient is accompanied by  who supplements the history. This is a 84 y.o.  year old RH  female who has had memory issues for about 1.5 years, worse over the last 2 months.  During the last Thanksgiving, her family noticed that she was having worsening of memory and speech disturbance.  Church  members began to notice as well, when she was repeating the same stories, asking the same questions.  Her PCP placed her on memantine 5 mg daily, then increasing it to 10 mg daily which for a while it helped especially with the slurring , but over the last 2 weeks, he increase it to 10 mg twice daily without significant improvement.  Her speech continues to be slow, but family reports that this is better than prior.  She becomes very upset because she has always been very organized, in charge of every document in the house, and now she is having problems and trying to stay focused and organized.  She denies being confused when entering the room, or leaving objects in unusual places.  She continues to drive but short distances, because over the last 6 months, if not using the GPS she will become lost if driving to unknown locations.  This has brought significant amount of frustration on her.  She becomes tearful at times, when she thinks that she could have Alzheimer's disease, as she took care of her mother with Alzheimer's when she was 18 years old.  She continues to read and do crossword puzzles and word findings.  She sleeps well, denies vivid dreams, sleepwalking, hallucinations or paranoia.  She is independent of bathing and dressing, there are no hygiene concerns.  She puts the medications on a pillbox (she could not think of the word pillbox ), and checks a calendar frequently did not forget appointments.  Her husband has taken over the finances since last month.  Her appetite is good, denies trouble swallowing.  She does not cook very often.  She ambulates without a cane, but she had a couple of falls, one in June due to heat exhaustion resulting in presyncope, and another mechanical fall hitting the right side of her head in November 2022 without loss of consciousness.  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  She denies focal numbness.  She denies a history of stroke.  She denies using the computer on excessive basis.  Denies hormonal therapy, or long distance trips.  Denies chest pain or palpitations.  Denies shortness of breath.  Denies a history of tremors or anosmia.  No history of seizures.  Denies urine frequency, but she does have mild incontinence.  Denies constipation or diarrhea.  Denies a history of sleep apnea, alcohol or tobacco.  States she has strong family history of Alzheimer's disease in mother, 2 sisters and maternal grandmother.     MRI brain 03/03/21 No acute or reversible finding. Generalized age related volume loss without lobar predominance. Mild chronic small-vessel ischemic changes of the pons and cerebral hemispheric white matter.     MRI of the brain 08/05/2023, personally reviewed remarkable for generalized brain atrophy, slightly progressed since 2023, no acute findings, mild chronic small vessel ischemic change of the cerebral hemispheric white matter similar to the MRI from 2023    Prior medication: Donepezil  more confused with it  02/06/2024    5:00 AM 03/20/2023    2:00 PM 09/23/2022   11:00 AM  MMSE - Mini Mental State Exam  Orientation to time 2 2 1   Orientation to Place 1 1 5   Registration 3 3 3   Attention/ Calculation 4 4 4   Recall 0 0 1  Language- name 2 objects 2 2 2   Language- repeat 1 1 1   Language- follow 3 step command 3 3 3   Language- read & follow direction 1 1 1   Write a sentence 1 1 1   Copy design 0 1 0  Total score 18 19 22       02/16/2021    8:00 AM  Montreal Cognitive Assessment   Visuospatial/ Executive (0/5) 3  Naming (0/3) 1  Attention: Read list of digits (0/2) 2  Attention: Read list of letters (0/1) 1  Attention: Serial 7 subtraction starting at 100 (0/3) 0   Language: Repeat phrase (0/2) 2  Language : Fluency (0/1) 1  Abstraction (0/2) 0  Delayed Recall (0/5) 0  Orientation (0/6) 5  Total 15  Adjusted Score (based on education) 15      Objective:    Neurological Exam:    VITALS:   Vitals:   02/05/24 1449  BP: (!) 142/78  Pulse: 67  Resp: 18  SpO2: 95%  Weight: 154 lb (69.9 kg)    GEN:  The patient appears stated age and is in NAD. HEENT:  Normocephalic, atraumatic.   Neurological examination:  General: NAD, well-groomed, appears stated age. Orientation: The patient is alert. Oriented to person, not to place or date Cranial nerves: There is good facial symmetry.The speech is not fluent, effortful, slow but clear, dysarthria, no aphasia. Fund of knowledge is reduced. Recent and remote memory are impaired. Attention and concentration are reduced. Able to name objects and repeat phrases.  Hearing is intact to conversational tone.   Sensation: Sensation is intact to light touch throughout Motor: Strength is at least antigravity x4. DTR's 2/4 in UE/LE     Movement examination:  Tone: There is normal tone in the UE/LE Abnormal movements:  no tremor.  No myoclonus.  No asterixis.   Coordination:  There is mild decremation with RAM's. Normal finger to nose  Gait and Station: The patient has no difficulty arising out of a deep-seated chair without the use of the hands. The patient's stride length is good.  Gait is cautious and narrow.    Thank you for allowing us  the opportunity to participate in the care of this nice patient. Please do not hesitate to contact us  for any questions or concerns.   Total time spent on 02/05/24 visit was 37 minutes dedicated to this patient today, preparing to see patient, examining the patient, ordering tests and/or medications and counseling the patient, documenting clinical information in the EHR or other health record, independently interpreting results and communicating results to the  patient/family, discussing treatment and goals, answering patient's questions and coordinating care.  Cc:  Husain, Karrar, MD  Camie Sevin 02/06/2024 5:47 AM      "

## 2024-02-05 ENCOUNTER — Ambulatory Visit: Admitting: Physician Assistant

## 2024-02-05 ENCOUNTER — Encounter: Payer: Self-pay | Admitting: Physician Assistant

## 2024-02-05 VITALS — BP 142/78 | HR 67 | Resp 18 | Wt 154.0 lb

## 2024-02-05 DIAGNOSIS — G309 Alzheimer's disease, unspecified: Secondary | ICD-10-CM

## 2024-02-05 DIAGNOSIS — F0280 Dementia in other diseases classified elsewhere without behavioral disturbance: Secondary | ICD-10-CM | POA: Diagnosis not present

## 2024-02-05 NOTE — Patient Instructions (Signed)
 It was a pleasure to see you today at our office.   Recommendations:  Continue Memantine 10 mg  2 times a day  Follow up 6  months    RECOMMENDATIONS FOR ALL PATIENTS WITH MEMORY PROBLEMS: 1. Continue to exercise (Recommend 30 minutes of walking everyday, or 3 hours every week) 2. Increase social interactions - continue going to [place] and enjoy social gatherings with friends and family 3. Eat healthy, avoid fried foods and eat more fruits and vegetables 4. Maintain adequate blood pressure, blood sugar, and blood cholesterol level. Reducing the risk of stroke and cardiovascular disease also helps promoting better memory. 5. Avoid stressful situations. Live a simple life and avoid aggravations. Organize your time and prepare for the next day in anticipation. 6. Sleep well, avoid any interruptions of sleep and avoid any distractions in the bedroom that may interfere with adequate sleep quality 7. Avoid sugar, avoid sweets as there is a strong link between excessive sugar intake, diabetes, and cognitive impairment We discussed the Mediterranean diet, which has been shown to help patients reduce the risk of progressive memory disorders and reduces cardiovascular risk. This includes eating fish, eat fruits and green leafy vegetables, nuts like almonds and hazelnuts, walnuts, and also use olive oil. Avoid fast foods and fried foods as much as possible. Avoid sweets and sugar as sugar use has been linked to worsening of memory function.  There is always a concern of gradual progression of memory problems. If this is the case, then we may need to adjust level of care according to patient needs. Support, both to the patient and caregiver, should then be put into place.     FALL PRECAUTIONS: Be cautious when walking. Scan the area for obstacles that may increase the risk of trips and falls. When getting up in the mornings, sit up at the edge of the bed for a few minutes before getting out of bed.  Consider elevating the bed at the head end to avoid drop of blood pressure when getting up. Walk always in a well-lit room (use night lights in the walls). Avoid area rugs or power cords from appliances in the middle of the walkways. Use a walker or a cane if necessary and consider physical therapy for balance exercise. Get your eyesight checked regularly.  FINANCIAL OVERSIGHT: Supervision, especially oversight when making financial decisions or transactions is also recommended.  HOME SAFETY: Consider the safety of the kitchen when operating appliances like stoves, microwave oven, and blender. Consider having supervision and share cooking responsibilities until no longer able to participate in those. Accidents with firearms and other hazards in the house should be identified and addressed as well.   ABILITY TO BE LEFT ALONE: If patient is unable to contact 911 operator, consider using LifeLine, or when the need is there, arrange for someone to stay with patients. Smoking is a fire hazard, consider supervision or cessation. Risk of wandering should be assessed by caregiver and if detected at any point, supervision and safe proof recommendations should be instituted.  MEDICATION SUPERVISION: Inability to self-administer medication needs to be constantly addressed. Implement a mechanism to ensure safe administration of the medications.   Mediterranean Diet A Mediterranean diet refers to food and lifestyle choices that are based on the traditions of countries located on the [employer]. This way of eating has been shown to help prevent certain conditions and improve outcomes for people who have chronic diseases, like kidney disease and heart disease. What are tips for following  this plan? Lifestyle  Cook and eat meals together with your family, when possible. Drink enough fluid to keep your urine clear or pale yellow. Be physically active every day. This includes: Aerobic exercise like running  or swimming. Leisure activities like gardening, walking, or housework. Get 7-8 hours of sleep each night. If recommended by your health care provider, drink red wine in moderation. This means 1 glass a day for nonpregnant women and 2 glasses a day for men. A glass of wine equals 5 oz (150 mL). Reading food labels  Check the serving size of packaged foods. For foods such as rice and pasta, the serving size refers to the amount of cooked product, not dry. Check the total fat in packaged foods. Avoid foods that have saturated fat or trans fats. Check the ingredients list for added sugars, such as corn syrup. Shopping  At the grocery store, buy most of your food from the areas near the walls of the store. This includes: Fresh fruits and vegetables (produce). Grains, beans, nuts, and seeds. Some of these may be available in unpackaged forms or large amounts (in bulk). Fresh seafood. Poultry and eggs. Low-fat dairy products. Buy whole ingredients instead of prepackaged foods. Buy fresh fruits and vegetables in-season from local farmers markets. Buy frozen fruits and vegetables in resealable bags. If you do not have access to quality fresh seafood, buy precooked frozen shrimp or canned fish, such as tuna, salmon, or sardines. Buy small amounts of raw or cooked vegetables, salads, or olives from the deli or salad bar at your store. Stock your pantry so you always have certain foods on hand, such as olive oil, canned tuna, canned tomatoes, rice, pasta, and beans. Cooking  Cook foods with extra-virgin olive oil instead of using butter or other vegetable oils. Have meat as a side dish, and have vegetables or grains as your main dish. This means having meat in small portions or adding small amounts of meat to foods like pasta or stew. Use beans or vegetables instead of meat in common dishes like chili or lasagna. Experiment with different cooking methods. Try roasting or broiling vegetables instead of  steaming or sauteing them. Add frozen vegetables to soups, stews, pasta, or rice. Add nuts or seeds for added healthy fat at each meal. You can add these to yogurt, salads, or vegetable dishes. Marinate fish or vegetables using olive oil, lemon juice, garlic, and fresh herbs. Meal planning  Plan to eat 1 vegetarian meal one day each week. Try to work up to 2 vegetarian meals, if possible. Eat seafood 2 or more times a week. Have healthy snacks readily available, such as: Vegetable sticks with hummus. Greek yogurt. Fruit and nut trail mix. Eat balanced meals throughout the week. This includes: Fruit: 2-3 servings a day Vegetables: 4-5 servings a day Low-fat dairy: 2 servings a day Fish, poultry, or lean meat: 1 serving a day Beans and legumes: 2 or more servings a week Nuts and seeds: 1-2 servings a day Whole grains: 6-8 servings a day Extra-virgin olive oil: 3-4 servings a day Limit red meat and sweets to only a few servings a month What are my food choices? Mediterranean diet Recommended Grains: Whole-grain pasta. Brown rice. Bulgar wheat. Polenta. Couscous. Whole-wheat bread. [place] Made[place]. Vegetables: Artichokes. Beets. Broccoli. Cabbage. Carrots. Eggplant. Green beans. Chard. Kale. Spinach. Onions. Leeks. Peas. Squash. Tomatoes. Peppers. Radishes. Fruits: Apples. Apricots. Avocado. Berries. Bananas. Cherries. Dates. Figs. Grapes. Lemons. Melon. Oranges. Peaches. Plums. Pomegranate. Meats and other protein foods:  Beans. Almonds. Sunflower seeds. Pine nuts. Peanuts. Cod. Salmon. Scallops. Shrimp. Tuna. Tilapia. Clams. Oysters. Eggs. Dairy: Low-fat milk. Cheese. Greek yogurt. Beverages: Water. Red wine. Herbal tea. Fats and oils: Extra virgin olive oil. Avocado oil. Grape seed oil. Sweets and desserts: Greek yogurt with honey. Baked apples. Poached pears. Trail mix. Seasoning and other foods: Basil. Cilantro. Coriander. Cumin. Mint. Parsley. Sage. Rosemary. Tarragon. Garlic.  Oregano. Thyme. Pepper. Balsalmic vinegar. Tahini. Hummus. Tomato sauce. Olives. Mushrooms. Limit these Grains: Prepackaged pasta or rice dishes. Prepackaged cereal with added sugar. Vegetables: Deep fried potatoes (french fries). Fruits: Fruit canned in syrup. Meats and other protein foods: Beef. Pork. Lamb. Poultry with skin. Hot dogs. [place]. Dairy: Ice cream. Sour cream. Whole milk. Beverages: Juice. Sugar-sweetened soft drinks. Beer. Liquor and spirits. Fats and oils: Butter. Canola oil. Vegetable oil. Beef fat (tallow). Lard. Sweets and desserts: Cookies. Cakes. Pies. Candy. Seasoning and other foods: Mayonnaise. Premade sauces and marinades. The items listed may not be a complete list. Talk with your dietitian about what dietary choices are right for you. Summary The Mediterranean diet includes both food and lifestyle choices. Eat a variety of fresh fruits and vegetables, beans, nuts, seeds, and whole grains. Limit the amount of red meat and sweets that you eat. Talk with your health care provider about whether it is safe for you to drink red wine in moderation. This means 1 glass a day for nonpregnant women and 2 glasses a day for men. A glass of wine equals 5 oz (150 mL). This information is not intended to replace advice given to you by your health care provider. Make sure you discuss any questions you have with your health care provider. Document Released: 08/24/2015 Document Revised: 09/26/2015 Document Reviewed: 08/24/2015 Elsevier Interactive Patient Education  2017 [employer].

## 2024-08-04 ENCOUNTER — Ambulatory Visit: Payer: Self-pay | Admitting: Physician Assistant
